# Patient Record
Sex: Male | Born: 1985
Health system: Southern US, Community
[De-identification: ages and names within clinical notes are randomized; demographics above are authoritative.]

## PROBLEM LIST (undated history)

## (undated) DIAGNOSIS — F4310 Post-traumatic stress disorder, unspecified: Secondary | ICD-10-CM

## (undated) DIAGNOSIS — T4145XA Adverse effect of unspecified anesthetic, initial encounter: Secondary | ICD-10-CM

## (undated) DIAGNOSIS — T8859XA Other complications of anesthesia, initial encounter: Secondary | ICD-10-CM

## (undated) DIAGNOSIS — I10 Essential (primary) hypertension: Secondary | ICD-10-CM

## (undated) HISTORY — PX: OTHER SURGICAL HISTORY: SHX169

## (undated) HISTORY — DX: Post-traumatic stress disorder, unspecified: F43.10

## (undated) HISTORY — PX: KNEE SURGERY: SHX244

---

## 2014-08-21 ENCOUNTER — Emergency Department (HOSPITAL_COMMUNITY)
Admission: EM | Admit: 2014-08-21 | Discharge: 2014-08-21 | Disposition: A | Discharge status: Home / Self Care | Payer: Non-veteran care | Attending: Emergency Medicine | Admitting: Emergency Medicine

## 2014-08-21 ENCOUNTER — Encounter (HOSPITAL_COMMUNITY): Payer: Self-pay

## 2014-08-21 ENCOUNTER — Emergency Department (HOSPITAL_COMMUNITY): Payer: Non-veteran care

## 2014-08-21 DIAGNOSIS — R109 Unspecified abdominal pain: Secondary | ICD-10-CM

## 2014-08-21 DIAGNOSIS — R1031 Right lower quadrant pain: Secondary | ICD-10-CM | POA: Insufficient documentation

## 2014-08-21 DIAGNOSIS — R1032 Left lower quadrant pain: Secondary | ICD-10-CM | POA: Insufficient documentation

## 2014-08-21 DIAGNOSIS — R1030 Lower abdominal pain, unspecified: Secondary | ICD-10-CM

## 2014-08-21 LAB — COMPREHENSIVE METABOLIC PANEL
ALT: 46 U/L (ref 0–53)
ANION GAP: 7 (ref 5–15)
AST: 36 U/L (ref 0–37)
Albumin: 3.9 g/dL (ref 3.5–5.2)
Alkaline Phosphatase: 70 U/L (ref 39–117)
BILIRUBIN TOTAL: 0.7 mg/dL (ref 0.3–1.2)
BUN: 12 mg/dL (ref 6–23)
CHLORIDE: 102 meq/L (ref 96–112)
CO2: 29 mmol/L (ref 19–32)
CREATININE: 0.95 mg/dL (ref 0.50–1.35)
Calcium: 9 mg/dL (ref 8.4–10.5)
GFR calc Af Amer: >90 mL/min (ref >90)
GFR calc non Af Amer: >90 mL/min (ref >90)
Glucose, Bld: 97 mg/dL (ref 70–99)
Potassium: 4.2 mmol/L (ref 3.5–5.1)
Sodium: 138 mmol/L (ref 135–145)
Total Protein: 6.2 g/dL (ref 6.0–8.3)

## 2014-08-21 LAB — URINALYSIS, ROUTINE W REFLEX MICROSCOPIC
Bilirubin Urine: NEGATIVE
Glucose, UA: NEGATIVE mg/dL
Hgb urine dipstick: NEGATIVE
KETONES UR: NEGATIVE mg/dL
LEUKOCYTES UA: NEGATIVE
NITRITE: NEGATIVE
PH: 7 (ref 5.0–8.0)
PROTEIN: NEGATIVE mg/dL
Specific Gravity, Urine: 1.017 (ref 1.005–1.030)
UROBILINOGEN UA: 0.2 mg/dL (ref 0.0–1.0)

## 2014-08-21 LAB — CBC WITH DIFFERENTIAL/PLATELET
BASOS PCT: 0 % (ref 0–1)
Basophils Absolute: 0 10*3/uL (ref 0.0–0.1)
EOS PCT: 1 % (ref 0–5)
Eosinophils Absolute: 0.1 10*3/uL (ref 0.0–0.7)
HCT: 46.2 % (ref 39.0–52.0)
Hemoglobin: 15.9 g/dL (ref 13.0–17.0)
LYMPHS PCT: 9 % — AB (ref 12–46)
Lymphs Abs: 1.3 10*3/uL (ref 0.7–4.0)
MCH: 29.9 pg (ref 26.0–34.0)
MCHC: 34.4 g/dL (ref 30.0–36.0)
MCV: 86.8 fL (ref 78.0–100.0)
Monocytes Absolute: 1 10*3/uL (ref 0.1–1.0)
Monocytes Relative: 7 % (ref 3–12)
Neutro Abs: 12.2 10*3/uL — ABNORMAL HIGH (ref 1.7–7.7)
Neutrophils Relative %: 83 % — ABNORMAL HIGH (ref 43–77)
Platelets: 227 10*3/uL (ref 150–400)
RBC: 5.32 MIL/uL (ref 4.22–5.81)
RDW: 12.9 % (ref 11.5–15.5)
WBC: 14.7 10*3/uL — AB (ref 4.0–10.5)

## 2014-08-21 MED ORDER — MORPHINE SULFATE 4 MG/ML IJ SOLN: 4.0000 mg | Freq: Once | INTRAMUSCULAR | Status: DC

## 2014-08-21 MED ORDER — SODIUM CHLORIDE 0.9 % IV BOLUS (SEPSIS)
1000.0000 mL | Freq: Once | INTRAVENOUS | Status: AC
  Administered 2014-08-21: 1000 mL via INTRAVENOUS

## 2014-08-21 MED ORDER — IOHEXOL 300 MG/ML  SOLN
25.0000 mL | Freq: Once | INTRAMUSCULAR | Status: AC | PRN
  Administered 2014-08-21: 25 mL via ORAL

## 2014-08-21 MED ORDER — IOHEXOL 300 MG/ML  SOLN
100.0000 mL | Freq: Once | INTRAMUSCULAR | Status: AC | PRN
  Administered 2014-08-21: 100 mL via INTRAVENOUS

## 2014-08-21 NOTE — ED Notes (Signed)
PA is at the bedside with pt 

## 2014-08-21 NOTE — Discharge Instructions (Signed)
Call for a follow up appointment with a Family or Primary Care Provider.  Return if Symptoms worsen.

## 2014-08-21 NOTE — ED Provider Notes (Signed)
CSN: [id number]     Arrival date & time 08/21/14  1049 History   First MD Initiated Contact with Patient 08/21/14 1302     Chief Complaint  Patient presents with  . Abdominal Pain     (Consider location/radiation/quality/duration/timing/severity/associated sxs/prior Treatment) HPI Comments: The patient is a 28 year old male presents emergency room chief complaint of abrupt onset of right lower quadrant pain, resolving. He reports pain worsened with movement and position initially. He reports symptom improvement after passing gas in the emergency department. Denies decrease in appetite, fever, diarrhea, constipation, hematuria, dysuria. Denies history of renal calculi previous abdominal surgeries. Patient is actively discussing going out for sushi after being discharged. Last oral intake less than 1 hour prior to arrival. [facility]  The history is provided by the patient. No language interpreter was used.    History reviewed. No pertinent past medical history. History reviewed. No pertinent past surgical history. No family history on file. History  Substance Use Topics  . Smoking status: Never Smoker   . Smokeless tobacco: Not on file  . Alcohol Use: Yes     Comment: occas    Review of Systems  Constitutional: Negative for fever, chills and appetite change.  Gastrointestinal: Positive for abdominal pain. Negative for nausea, vomiting and diarrhea.  Genitourinary: Negative for dysuria and hematuria.      Allergies  Review of patient's allergies indicates no known allergies.  Home Medications   Prior to Admission medications   Not on File   BP 157/87 mmHg  Pulse 103  Temp(Src) 98.4 F (36.9 C) (Oral)  Resp 20  SpO2 100% Physical Exam  Constitutional: He is oriented to person, place, and time. He appears well-developed and well-nourished. No distress.  HENT:  Head: Normocephalic and atraumatic.  Mouth/Throat: No oropharyngeal exudate.  Eyes: EOM are normal. Pupils are  equal, round, and reactive to light.  Neck: Neck supple.  Cardiovascular: Normal rate and regular rhythm.   Pulmonary/Chest: Effort normal. No respiratory distress. He has no wheezes.  Abdominal: Soft. There is tenderness in the right lower quadrant, suprapubic area and left lower quadrant. There is no rebound, no guarding, no CVA tenderness, no tenderness at McBurney's point and negative Murphy's sign.  Mild lower abdominal discomfort with palpation. Negative psoas, negative rosving's.  Musculoskeletal: Normal range of motion.  Neurological: He is oriented to person, place, and time.  Skin: Skin is warm and dry. He is not diaphoretic.  Psychiatric: He has a normal mood and affect. His behavior is normal.  Nursing note and vitals reviewed.   ED Course  Procedures (including critical care time) Labs Review Labs Reviewed  CBC WITH DIFFERENTIAL - Abnormal; Notable for the following:    WBC 14.7 (*)    Neutrophils Relative % 83 (*)    Neutro Abs 12.2 (*)    Lymphocytes Relative 9 (*)    All other components within normal limits  COMPREHENSIVE METABOLIC PANEL  URINALYSIS, ROUTINE W REFLEX MICROSCOPIC    Imaging Review Ct Abdomen Pelvis W Contrast  08/21/2014   CLINICAL DATA:  Acute onset right lower quadrant pain this morning which has now resolved. Mid abdominal pain presently.  EXAM: CT ABDOMEN AND PELVIS WITH CONTRAST  TECHNIQUE: Multidetector CT imaging of the abdomen and pelvis was performed using the standard protocol following bolus administration of intravenous contrast.  CONTRAST:  100 mL OMNIPAQUE IOHEXOL 300 MG/ML  SOLN  COMPARISON:  None.  FINDINGS: The lung bases are clear. No pleural or pericardial effusion. Heart size  is normal.  The gallbladder, liver, spleen, adrenal glands, pancreas and kidneys all appear normal. The stomach, small and large bowel and appendix appear normal. There is no lymphadenopathy or fluid. No bony abnormality is identified.  IMPRESSION: Negative for  appendicitis.  Negative CT abdomen and pelvis.   Electronically Signed   By: Drusilla Kannerhomas  Dalessio M.D.   On: 08/21/2014 14:42     EKG Interpretation None      MDM   Final diagnoses:  Abdominal pain   Patient presents with generalized lower abdominal pain, initially right lower quadrant no point tenderness. CT to rule out acute appendicitis. However the patient is discussing food actively, in no acute distress at this time. UA negative for blood, no CVA tenderness doubt renal calculi. CBC shows leukocytosis 14.7. Awaiting CT results. CT without acute abnormalities.  1450 discussed negative CT results with the patient, reports after multiple episodes of gas in ED and during CT continues to have resolution of abdominal discomfort. Discussed patient discomfort likely due to gas, plan to follow-up with PCP.  Meds given in ED:  Medications  morphine 4 MG/ML injection 4 mg (0 mg Intravenous Hold 08/21/14 1323)  iohexol (OMNIPAQUE) 300 MG/ML solution 25 mL (25 mLs Oral Contrast Given 08/21/14 1220)  sodium chloride 0.9 % bolus 1,000 mL (1,000 mLs Intravenous New Bag/Given 08/21/14 1324)  iohexol (OMNIPAQUE) 300 MG/ML solution 100 mL (100 mLs Intravenous Contrast Given 08/21/14 1413)    New Prescriptions   No medications on file    Mellody DrownLauren Sheilah Rayos, PA-C 08/21/14 1458  Warnell Foresterrey Wofford, MD 08/21/14 1807

## 2014-08-21 NOTE — ED Notes (Addendum)
Pt having sudden onset RLQ pain with nausea, no vomiting. Stool was runny this morning when he used the restroom. No fever.

## 2017-03-31 DIAGNOSIS — R55 Syncope and collapse: Secondary | ICD-10-CM | POA: Diagnosis not present

## 2017-03-31 DIAGNOSIS — A084 Viral intestinal infection, unspecified: Secondary | ICD-10-CM | POA: Diagnosis not present

## 2017-03-31 DIAGNOSIS — J069 Acute upper respiratory infection, unspecified: Secondary | ICD-10-CM | POA: Diagnosis not present

## 2017-07-18 DIAGNOSIS — M25462 Effusion, left knee: Secondary | ICD-10-CM | POA: Diagnosis not present

## 2017-07-22 DIAGNOSIS — M25562 Pain in left knee: Secondary | ICD-10-CM | POA: Diagnosis not present

## 2017-07-23 DIAGNOSIS — M25562 Pain in left knee: Secondary | ICD-10-CM | POA: Diagnosis not present

## 2017-07-31 DIAGNOSIS — F4310 Post-traumatic stress disorder, unspecified: Secondary | ICD-10-CM | POA: Diagnosis not present

## 2017-08-05 DIAGNOSIS — F4310 Post-traumatic stress disorder, unspecified: Secondary | ICD-10-CM | POA: Diagnosis not present

## 2017-08-06 DIAGNOSIS — F4310 Post-traumatic stress disorder, unspecified: Secondary | ICD-10-CM | POA: Diagnosis not present

## 2017-08-08 DIAGNOSIS — F4310 Post-traumatic stress disorder, unspecified: Secondary | ICD-10-CM | POA: Diagnosis not present

## 2017-08-09 DIAGNOSIS — F4310 Post-traumatic stress disorder, unspecified: Secondary | ICD-10-CM | POA: Diagnosis not present

## 2017-08-11 DIAGNOSIS — F4310 Post-traumatic stress disorder, unspecified: Secondary | ICD-10-CM | POA: Diagnosis not present

## 2017-08-12 DIAGNOSIS — F4310 Post-traumatic stress disorder, unspecified: Secondary | ICD-10-CM | POA: Diagnosis not present

## 2017-08-15 DIAGNOSIS — F4310 Post-traumatic stress disorder, unspecified: Secondary | ICD-10-CM | POA: Diagnosis not present

## 2017-08-18 DIAGNOSIS — F4310 Post-traumatic stress disorder, unspecified: Secondary | ICD-10-CM | POA: Diagnosis not present

## 2017-08-21 DIAGNOSIS — F4310 Post-traumatic stress disorder, unspecified: Secondary | ICD-10-CM | POA: Diagnosis not present

## 2017-08-22 DIAGNOSIS — F4310 Post-traumatic stress disorder, unspecified: Secondary | ICD-10-CM | POA: Diagnosis not present

## 2017-08-25 DIAGNOSIS — F4310 Post-traumatic stress disorder, unspecified: Secondary | ICD-10-CM | POA: Diagnosis not present

## 2017-08-26 DIAGNOSIS — F4310 Post-traumatic stress disorder, unspecified: Secondary | ICD-10-CM | POA: Diagnosis not present

## 2017-08-29 DIAGNOSIS — F4310 Post-traumatic stress disorder, unspecified: Secondary | ICD-10-CM | POA: Diagnosis not present

## 2017-09-19 DIAGNOSIS — F419 Anxiety disorder, unspecified: Secondary | ICD-10-CM | POA: Diagnosis not present

## 2017-09-23 DIAGNOSIS — F419 Anxiety disorder, unspecified: Secondary | ICD-10-CM | POA: Diagnosis not present

## 2017-09-24 DIAGNOSIS — J101 Influenza due to other identified influenza virus with other respiratory manifestations: Secondary | ICD-10-CM | POA: Diagnosis not present

## 2017-10-10 DIAGNOSIS — F419 Anxiety disorder, unspecified: Secondary | ICD-10-CM | POA: Diagnosis not present

## 2017-11-20 ENCOUNTER — Encounter: Payer: Self-pay | Admitting: Gastroenterology

## 2017-11-20 DIAGNOSIS — R030 Elevated blood-pressure reading, without diagnosis of hypertension: Secondary | ICD-10-CM | POA: Diagnosis not present

## 2017-11-20 DIAGNOSIS — Z6837 Body mass index (BMI) 37.0-37.9, adult: Secondary | ICD-10-CM | POA: Diagnosis not present

## 2017-11-20 DIAGNOSIS — R112 Nausea with vomiting, unspecified: Secondary | ICD-10-CM | POA: Diagnosis not present

## 2017-11-20 DIAGNOSIS — R197 Diarrhea, unspecified: Secondary | ICD-10-CM | POA: Diagnosis not present

## 2017-11-24 ENCOUNTER — Other Ambulatory Visit: Payer: Self-pay | Admitting: Family Medicine

## 2017-11-24 ENCOUNTER — Encounter: Payer: Self-pay | Admitting: Gastroenterology

## 2017-11-24 ENCOUNTER — Ambulatory Visit
Admission: RE | Admit: 2017-11-24 | Discharge: 2017-11-24 | Disposition: A | Discharge status: Home / Self Care | Payer: BLUE CROSS/BLUE SHIELD | Source: Ambulatory Visit | Attending: Family Medicine | Admitting: Family Medicine

## 2017-11-24 ENCOUNTER — Encounter: Payer: Self-pay | Admitting: Family Medicine

## 2017-11-24 DIAGNOSIS — R109 Unspecified abdominal pain: Secondary | ICD-10-CM

## 2017-11-24 DIAGNOSIS — R030 Elevated blood-pressure reading, without diagnosis of hypertension: Secondary | ICD-10-CM | POA: Diagnosis not present

## 2017-11-24 DIAGNOSIS — K5792 Diverticulitis of intestine, part unspecified, without perforation or abscess without bleeding: Secondary | ICD-10-CM | POA: Diagnosis not present

## 2017-11-24 DIAGNOSIS — R197 Diarrhea, unspecified: Secondary | ICD-10-CM

## 2017-11-24 DIAGNOSIS — K921 Melena: Secondary | ICD-10-CM | POA: Diagnosis not present

## 2017-11-24 DIAGNOSIS — Z6837 Body mass index (BMI) 37.0-37.9, adult: Secondary | ICD-10-CM | POA: Diagnosis not present

## 2017-11-24 MED ORDER — IOPAMIDOL (ISOVUE-300) INJECTION 61%
125.0000 mL | Freq: Once | INTRAVENOUS | Status: AC | PRN
  Administered 2017-11-24: 125 mL via INTRAVENOUS

## 2017-11-25 ENCOUNTER — Telehealth: Payer: Self-pay | Admitting: Internal Medicine

## 2017-11-25 DIAGNOSIS — R197 Diarrhea, unspecified: Secondary | ICD-10-CM | POA: Diagnosis not present

## 2017-11-25 DIAGNOSIS — K529 Noninfective gastroenteritis and colitis, unspecified: Secondary | ICD-10-CM | POA: Diagnosis not present

## 2017-11-25 DIAGNOSIS — Z6837 Body mass index (BMI) 37.0-37.9, adult: Secondary | ICD-10-CM | POA: Diagnosis not present

## 2017-11-25 DIAGNOSIS — K6289 Other specified diseases of anus and rectum: Secondary | ICD-10-CM | POA: Diagnosis not present

## 2017-11-25 NOTE — Telephone Encounter (Signed)
[facility] let [facility] know that the patient has an upcoming appt with Dr Lavon PaganiniNandigam on 11/27/17.  I also spoke with the patient and encouraged him to keep this appt.

## 2017-11-25 NOTE — Telephone Encounter (Signed)
Please see note below. Thank you.

## 2017-11-25 NOTE — Telephone Encounter (Signed)
[facility]ab[facility] called back to clarify, Dr Marina GoodellPerry is not going to call their office, he does not feel that any treatment is necessary before his appt with Dr Lavon PaganiniNandigam this Thursday

## 2017-11-25 NOTE — Telephone Encounter (Signed)
Please let Dr. Duanne Guessewey know that I have reviewed the CT scan of this young man. The patient needs an appointment with advanced GI practitioner this week. No treatment in the interim needed. Please arrange appointment

## 2017-11-26 DIAGNOSIS — K6289 Other specified diseases of anus and rectum: Secondary | ICD-10-CM | POA: Diagnosis not present

## 2017-11-26 DIAGNOSIS — R197 Diarrhea, unspecified: Secondary | ICD-10-CM | POA: Diagnosis not present

## 2017-11-26 DIAGNOSIS — R030 Elevated blood-pressure reading, without diagnosis of hypertension: Secondary | ICD-10-CM | POA: Diagnosis not present

## 2017-11-26 DIAGNOSIS — R109 Unspecified abdominal pain: Secondary | ICD-10-CM | POA: Diagnosis not present

## 2017-11-27 ENCOUNTER — Other Ambulatory Visit (INDEPENDENT_AMBULATORY_CARE_PROVIDER_SITE_OTHER): Payer: BLUE CROSS/BLUE SHIELD

## 2017-11-27 ENCOUNTER — Encounter: Payer: Self-pay | Admitting: Gastroenterology

## 2017-11-27 ENCOUNTER — Ambulatory Visit (INDEPENDENT_AMBULATORY_CARE_PROVIDER_SITE_OTHER): Payer: BLUE CROSS/BLUE SHIELD | Admitting: Gastroenterology

## 2017-11-27 VITALS — BP 136/84 | HR 76 | Ht 75.0 in | Wt 287.4 lb

## 2017-11-27 DIAGNOSIS — K602 Anal fissure, unspecified: Secondary | ICD-10-CM

## 2017-11-27 DIAGNOSIS — K625 Hemorrhage of anus and rectum: Secondary | ICD-10-CM | POA: Diagnosis not present

## 2017-11-27 DIAGNOSIS — R197 Diarrhea, unspecified: Secondary | ICD-10-CM

## 2017-11-27 LAB — CBC WITH DIFFERENTIAL/PLATELET
BASOS PCT: 0.1 % (ref 0.0–3.0)
Basophils Absolute: 0 10*3/uL (ref 0.0–0.1)
EOS PCT: 2 % (ref 0.0–5.0)
Eosinophils Absolute: 0.1 10*3/uL (ref 0.0–0.7)
HCT: 46.3 % (ref 39.0–52.0)
Hemoglobin: 15.9 g/dL (ref 13.0–17.0)
Lymphocytes Relative: 25.5 % (ref 12.0–46.0)
Lymphs Abs: 1.9 10*3/uL (ref 0.7–4.0)
MCHC: 34.4 g/dL (ref 30.0–36.0)
MCV: 86.5 fl (ref 78.0–100.0)
MONO ABS: 0.7 10*3/uL (ref 0.1–1.0)
Monocytes Relative: 9.2 % (ref 3.0–12.0)
Neutro Abs: 4.8 10*3/uL (ref 1.4–7.7)
Neutrophils Relative %: 63.2 % (ref 43.0–77.0)
Platelets: 286 10*3/uL (ref 150.0–400.0)
RBC: 5.35 Mil/uL (ref 4.22–5.81)
RDW: 13.9 % (ref 11.5–15.5)
WBC: 7.6 10*3/uL (ref 4.0–10.5)

## 2017-11-27 LAB — COMPREHENSIVE METABOLIC PANEL
ALT: 59 U/L — ABNORMAL HIGH (ref 0–53)
AST: 45 U/L — ABNORMAL HIGH (ref 0–37)
Albumin: 4.3 g/dL (ref 3.5–5.2)
Alkaline Phosphatase: 72 U/L (ref 39–117)
BUN: 12 mg/dL (ref 6–23)
CHLORIDE: 102 meq/L (ref 96–112)
CO2: 30 mEq/L (ref 19–32)
Calcium: 9.7 mg/dL (ref 8.4–10.5)
Creatinine, Ser: 1.04 mg/dL (ref 0.40–1.50)
GFR: 89.25 mL/min (ref >60.00)
Glucose, Bld: 87 mg/dL (ref 70–99)
Potassium: 3.9 mEq/L (ref 3.5–5.1)
SODIUM: 139 meq/L (ref 135–145)
Total Bilirubin: 0.4 mg/dL (ref 0.2–1.2)
Total Protein: 7.3 g/dL (ref 6.0–8.3)

## 2017-11-27 LAB — HIGH SENSITIVITY CRP: CRP, High Sensitivity: 2.6 mg/L (ref 0.000–5.000)

## 2017-11-27 LAB — SEDIMENTATION RATE: SED RATE: 5 mm/h (ref 0–15)

## 2017-11-27 MED ORDER — AMBULATORY NON FORMULARY MEDICATION
1 refills | Status: DC
Start: 2017-11-27 — End: 2017-12-04

## 2017-11-27 NOTE — Patient Instructions (Signed)
Normal BMI (Body Mass Index- based on height and weight) is between 19 and 25. Your BMI today is Body mass index is 35.92 kg/m. Marland Kitchen. Please consider follow up  regarding your BMI with your Primary Care Provider.  Please take Benifiber 1 tablespoon 3 times daily with meals  Your provider has requested that you go to the basement level for lab work before leaving today. Press "B" on the elevator. The lab is located at the first door on the left as you exit the elevator.
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
## 2017-11-29 ENCOUNTER — Encounter: Payer: Self-pay | Admitting: Gastroenterology

## 2017-12-02 ENCOUNTER — Encounter (HOSPITAL_COMMUNITY): Payer: Self-pay

## 2017-12-02 ENCOUNTER — Emergency Department (HOSPITAL_COMMUNITY): Payer: BLUE CROSS/BLUE SHIELD

## 2017-12-02 ENCOUNTER — Inpatient Hospital Stay (HOSPITAL_COMMUNITY)
Admission: EM | Admit: 2017-12-02 | Discharge: 2017-12-04 | DRG: 392 | Disposition: A | Discharge status: Home / Self Care | Payer: BLUE CROSS/BLUE SHIELD | Attending: Internal Medicine | Admitting: Internal Medicine

## 2017-12-02 DIAGNOSIS — K529 Noninfective gastroenteritis and colitis, unspecified: Secondary | ICD-10-CM | POA: Diagnosis not present

## 2017-12-02 DIAGNOSIS — A419 Sepsis, unspecified organism: Secondary | ICD-10-CM

## 2017-12-02 DIAGNOSIS — K648 Other hemorrhoids: Secondary | ICD-10-CM | POA: Diagnosis not present

## 2017-12-02 DIAGNOSIS — Z6836 Body mass index (BMI) 36.0-36.9, adult: Secondary | ICD-10-CM | POA: Diagnosis not present

## 2017-12-02 DIAGNOSIS — Z833 Family history of diabetes mellitus: Secondary | ICD-10-CM

## 2017-12-02 DIAGNOSIS — K621 Rectal polyp: Secondary | ICD-10-CM | POA: Diagnosis present

## 2017-12-02 DIAGNOSIS — Z9119 Patient's noncompliance with other medical treatment and regimen: Secondary | ICD-10-CM

## 2017-12-02 DIAGNOSIS — E669 Obesity, unspecified: Secondary | ICD-10-CM | POA: Diagnosis present

## 2017-12-02 DIAGNOSIS — R079 Chest pain, unspecified: Secondary | ICD-10-CM | POA: Diagnosis not present

## 2017-12-02 DIAGNOSIS — K5190 Ulcerative colitis, unspecified, without complications: Secondary | ICD-10-CM | POA: Diagnosis not present

## 2017-12-02 DIAGNOSIS — R202 Paresthesia of skin: Secondary | ICD-10-CM | POA: Diagnosis not present

## 2017-12-02 DIAGNOSIS — Z8249 Family history of ischemic heart disease and other diseases of the circulatory system: Secondary | ICD-10-CM

## 2017-12-02 DIAGNOSIS — K921 Melena: Secondary | ICD-10-CM

## 2017-12-02 DIAGNOSIS — K5730 Diverticulosis of large intestine without perforation or abscess without bleeding: Secondary | ICD-10-CM | POA: Diagnosis not present

## 2017-12-02 DIAGNOSIS — A09 Infectious gastroenteritis and colitis, unspecified: Principal | ICD-10-CM | POA: Diagnosis present

## 2017-12-02 DIAGNOSIS — K5140 Inflammatory polyps of colon without complications: Secondary | ICD-10-CM | POA: Diagnosis not present

## 2017-12-02 DIAGNOSIS — K6289 Other specified diseases of anus and rectum: Secondary | ICD-10-CM | POA: Diagnosis not present

## 2017-12-02 DIAGNOSIS — K6389 Other specified diseases of intestine: Secondary | ICD-10-CM | POA: Diagnosis not present

## 2017-12-02 HISTORY — DX: Other complications of anesthesia, initial encounter: T88.59XA

## 2017-12-02 HISTORY — DX: Adverse effect of unspecified anesthetic, initial encounter: T41.45XA

## 2017-12-02 LAB — I-STAT CG4 LACTIC ACID, ED: Lactic Acid, Venous: 0.88 mmol/L (ref 0.5–1.9)

## 2017-12-02 LAB — I-STAT TROPONIN, ED: TROPONIN I, POC: 0 ng/mL (ref 0.00–0.08)

## 2017-12-02 MED ORDER — ACETAMINOPHEN 500 MG PO TABS
1000.0000 mg | ORAL_TABLET | Freq: Once | ORAL | Status: AC
  Administered 2017-12-03: 1000 mg via ORAL
  Filled 2017-12-02: qty 2

## 2017-12-02 MED ORDER — SODIUM CHLORIDE 0.9 % IV BOLUS
3000.0000 mL | Freq: Once | INTRAVENOUS | Status: AC
  Administered 2017-12-02: 3000 mL via INTRAVENOUS

## 2017-12-02 NOTE — ED Triage Notes (Signed)
Pt complains of chest pain and being short of breath since today, he states he's had rectal bleeding for a week and it hurts to take a deep breath or talk Pt is being seen by a GI doctor and has received antibiotics
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
## 2017-12-03 ENCOUNTER — Emergency Department (HOSPITAL_COMMUNITY): Payer: BLUE CROSS/BLUE SHIELD

## 2017-12-03 ENCOUNTER — Encounter (HOSPITAL_COMMUNITY): Payer: Self-pay

## 2017-12-03 ENCOUNTER — Inpatient Hospital Stay (HOSPITAL_COMMUNITY): Payer: BLUE CROSS/BLUE SHIELD

## 2017-12-03 ENCOUNTER — Other Ambulatory Visit: Payer: Self-pay

## 2017-12-03 DIAGNOSIS — Z6836 Body mass index (BMI) 36.0-36.9, adult: Secondary | ICD-10-CM | POA: Diagnosis not present

## 2017-12-03 DIAGNOSIS — E669 Obesity, unspecified: Secondary | ICD-10-CM | POA: Diagnosis present

## 2017-12-03 DIAGNOSIS — Z9119 Patient's noncompliance with other medical treatment and regimen: Secondary | ICD-10-CM | POA: Diagnosis not present

## 2017-12-03 DIAGNOSIS — K5140 Inflammatory polyps of colon without complications: Secondary | ICD-10-CM | POA: Diagnosis not present

## 2017-12-03 DIAGNOSIS — K6289 Other specified diseases of anus and rectum: Secondary | ICD-10-CM | POA: Diagnosis not present

## 2017-12-03 DIAGNOSIS — K529 Noninfective gastroenteritis and colitis, unspecified: Secondary | ICD-10-CM | POA: Diagnosis not present

## 2017-12-03 DIAGNOSIS — R202 Paresthesia of skin: Secondary | ICD-10-CM | POA: Diagnosis not present

## 2017-12-03 DIAGNOSIS — A09 Infectious gastroenteritis and colitis, unspecified: Secondary | ICD-10-CM | POA: Diagnosis present

## 2017-12-03 DIAGNOSIS — K648 Other hemorrhoids: Secondary | ICD-10-CM | POA: Diagnosis present

## 2017-12-03 DIAGNOSIS — K621 Rectal polyp: Secondary | ICD-10-CM | POA: Diagnosis not present

## 2017-12-03 DIAGNOSIS — K5190 Ulcerative colitis, unspecified, without complications: Secondary | ICD-10-CM | POA: Diagnosis not present

## 2017-12-03 DIAGNOSIS — K6389 Other specified diseases of intestine: Secondary | ICD-10-CM | POA: Diagnosis not present

## 2017-12-03 DIAGNOSIS — K5730 Diverticulosis of large intestine without perforation or abscess without bleeding: Secondary | ICD-10-CM | POA: Diagnosis present

## 2017-12-03 DIAGNOSIS — K921 Melena: Secondary | ICD-10-CM | POA: Diagnosis not present

## 2017-12-03 DIAGNOSIS — Z833 Family history of diabetes mellitus: Secondary | ICD-10-CM | POA: Diagnosis not present

## 2017-12-03 DIAGNOSIS — Z8249 Family history of ischemic heart disease and other diseases of the circulatory system: Secondary | ICD-10-CM | POA: Diagnosis not present

## 2017-12-03 LAB — URINALYSIS, ROUTINE W REFLEX MICROSCOPIC
BACTERIA UA: NONE SEEN
BILIRUBIN URINE: NEGATIVE
Glucose, UA: NEGATIVE mg/dL
KETONES UR: NEGATIVE mg/dL
LEUKOCYTES UA: NEGATIVE
NITRITE: NEGATIVE
Protein, ur: NEGATIVE mg/dL
SPECIFIC GRAVITY, URINE: 1.021 (ref 1.005–1.030)
SQUAMOUS EPITHELIAL / LPF: NONE SEEN
pH: 5 (ref 5.0–8.0)

## 2017-12-03 LAB — GASTROINTESTINAL PANEL BY PCR, STOOL (REPLACES STOOL CULTURE)
ADENOVIRUS F40/41: NOT DETECTED
ASTROVIRUS: NOT DETECTED
CAMPYLOBACTER SPECIES: NOT DETECTED
Cryptosporidium: NOT DETECTED
Cyclospora cayetanensis: NOT DETECTED
ENTAMOEBA HISTOLYTICA: NOT DETECTED
ENTEROPATHOGENIC E COLI (EPEC): NOT DETECTED
ENTEROTOXIGENIC E COLI (ETEC): NOT DETECTED
Enteroaggregative E coli (EAEC): NOT DETECTED
Giardia lamblia: NOT DETECTED
NOROVIRUS GI/GII: NOT DETECTED
PLESIMONAS SHIGELLOIDES: NOT DETECTED
Rotavirus A: NOT DETECTED
Salmonella species: NOT DETECTED
Sapovirus (I, II, IV, and V): NOT DETECTED
Shiga like toxin producing E coli (STEC): NOT DETECTED
Shigella/Enteroinvasive E coli (EIEC): NOT DETECTED
VIBRIO CHOLERAE: NOT DETECTED
VIBRIO SPECIES: NOT DETECTED
Yersinia enterocolitica: NOT DETECTED

## 2017-12-03 LAB — COMPREHENSIVE METABOLIC PANEL
ALBUMIN: 3.4 g/dL — AB (ref 3.5–5.0)
ALT: 37 U/L (ref 17–63)
AST: 25 U/L (ref 15–41)
Alkaline Phosphatase: 56 U/L (ref 38–126)
Anion gap: 9 (ref 5–15)
BUN: 9 mg/dL (ref 6–20)
CALCIUM: 8.1 mg/dL — AB (ref 8.9–10.3)
CHLORIDE: 109 mmol/L (ref 101–111)
CO2: 21 mmol/L — AB (ref 22–32)
Creatinine, Ser: 0.94 mg/dL (ref 0.61–1.24)
GFR calc non Af Amer: >60 mL/min (ref >60)
GLUCOSE: 102 mg/dL — AB (ref 65–99)
Potassium: 3.7 mmol/L (ref 3.5–5.1)
SODIUM: 139 mmol/L (ref 135–145)
Total Bilirubin: 1 mg/dL (ref 0.3–1.2)
Total Protein: 6.3 g/dL — ABNORMAL LOW (ref 6.5–8.1)

## 2017-12-03 LAB — BASIC METABOLIC PANEL
Anion gap: 11 (ref 5–15)
BUN: 12 mg/dL (ref 6–20)
CALCIUM: 9 mg/dL (ref 8.9–10.3)
CO2: 23 mmol/L (ref 22–32)
Chloride: 103 mmol/L (ref 101–111)
Creatinine, Ser: 1.22 mg/dL (ref 0.61–1.24)
GFR calc non Af Amer: >60 mL/min (ref >60)
Glucose, Bld: 115 mg/dL — ABNORMAL HIGH (ref 65–99)
Potassium: 3.6 mmol/L (ref 3.5–5.1)
SODIUM: 137 mmol/L (ref 135–145)

## 2017-12-03 LAB — HEPATIC FUNCTION PANEL
ALT: 49 U/L (ref 17–63)
AST: 33 U/L (ref 15–41)
Albumin: 4.2 g/dL (ref 3.5–5.0)
Alkaline Phosphatase: 69 U/L (ref 38–126)
BILIRUBIN DIRECT: 0.1 mg/dL (ref 0.1–0.5)
BILIRUBIN INDIRECT: 1 mg/dL — AB (ref 0.3–0.9)
TOTAL PROTEIN: 7.4 g/dL (ref 6.5–8.1)
Total Bilirubin: 1.1 mg/dL (ref 0.3–1.2)

## 2017-12-03 LAB — CBC
HCT: 40.1 % (ref 39.0–52.0)
HCT: 43.7 % (ref 39.0–52.0)
Hemoglobin: 13.5 g/dL (ref 13.0–17.0)
Hemoglobin: 15.1 g/dL (ref 13.0–17.0)
MCH: 29.4 pg (ref 26.0–34.0)
MCH: 30.1 pg (ref 26.0–34.0)
MCHC: 33.7 g/dL (ref 30.0–36.0)
MCHC: 34.6 g/dL (ref 30.0–36.0)
MCV: 87.4 fL (ref 78.0–100.0)
MCV: 87.2 fL (ref 78.0–100.0)
PLATELETS: 198 10*3/uL (ref 150–400)
PLATELETS: 252 10*3/uL (ref 150–400)
RBC: 4.59 MIL/uL (ref 4.22–5.81)
RBC: 5.01 MIL/uL (ref 4.22–5.81)
RDW: 13.3 % (ref 11.5–15.5)
RDW: 13.5 % (ref 11.5–15.5)
WBC: 15.3 10*3/uL — ABNORMAL HIGH (ref 4.0–10.5)
WBC: 22 10*3/uL — AB (ref 4.0–10.5)

## 2017-12-03 LAB — C DIFFICILE QUICK SCREEN W PCR REFLEX
C Diff antigen: NEGATIVE
C Diff interpretation: NOT DETECTED
C Diff toxin: NEGATIVE

## 2017-12-03 LAB — HIV ANTIBODY (ROUTINE TESTING W REFLEX): HIV SCREEN 4TH GENERATION: NONREACTIVE

## 2017-12-03 MED ORDER — ONDANSETRON HCL 4 MG PO TABS: 4.0000 mg | ORAL_TABLET | Freq: Four times a day (QID) | ORAL | Status: DC | PRN

## 2017-12-03 MED ORDER — BOOST / RESOURCE BREEZE PO LIQD CUSTOM
1.0000 | Freq: Three times a day (TID) | ORAL | Status: DC
  Administered 2017-12-03: 1 via ORAL
  Administered 2017-12-03: 20:00:00 via ORAL
  Administered 2017-12-03: 1 via ORAL

## 2017-12-03 MED ORDER — POTASSIUM CHLORIDE IN NACL 20-0.9 MEQ/L-% IV SOLN
INTRAVENOUS | Status: DC
  Administered 2017-12-03: 06:00:00 via INTRAVENOUS
  Filled 2017-12-03: qty 1000

## 2017-12-03 MED ORDER — ONDANSETRON HCL 4 MG/2ML IJ SOLN: 4.0000 mg | Freq: Four times a day (QID) | INTRAMUSCULAR | Status: DC | PRN

## 2017-12-03 MED ORDER — METRONIDAZOLE IN NACL 5-0.79 MG/ML-% IV SOLN
500.0000 mg | Freq: Three times a day (TID) | INTRAVENOUS | Status: DC
  Administered 2017-12-03 – 2017-12-04 (×4): 500 mg via INTRAVENOUS
  Filled 2017-12-03 (×4): qty 100

## 2017-12-03 MED ORDER — IOPAMIDOL (ISOVUE-300) INJECTION 61%
INTRAVENOUS | Status: AC
  Filled 2017-12-03: qty 100

## 2017-12-03 MED ORDER — TRAMADOL HCL 50 MG PO TABS
50.0000 mg | ORAL_TABLET | Freq: Four times a day (QID) | ORAL | Status: DC | PRN
  Administered 2017-12-03: 50 mg via ORAL
  Filled 2017-12-03: qty 1

## 2017-12-03 MED ORDER — PEG-KCL-NACL-NASULF-NA ASC-C 100 G PO SOLR
0.5000 | Freq: Once | ORAL | Status: AC
  Administered 2017-12-03: 100 g via ORAL
  Filled 2017-12-03: qty 1

## 2017-12-03 MED ORDER — CIPROFLOXACIN IN D5W 400 MG/200ML IV SOLN
400.0000 mg | Freq: Two times a day (BID) | INTRAVENOUS | Status: DC
Start: 2017-12-03 — End: 2017-12-04
  Administered 2017-12-03 – 2017-12-04 (×3): 400 mg via INTRAVENOUS
  Filled 2017-12-03 (×5): qty 200

## 2017-12-03 MED ORDER — PEG-KCL-NACL-NASULF-NA ASC-C 100 G PO SOLR: 1.0000 | Freq: Once | ORAL | Status: DC

## 2017-12-03 MED ORDER — ACETAMINOPHEN 650 MG RE SUPP: 650.0000 mg | Freq: Four times a day (QID) | RECTAL | Status: DC | PRN

## 2017-12-03 MED ORDER — MORPHINE SULFATE (PF) 4 MG/ML IV SOLN
4.0000 mg | Freq: Once | INTRAVENOUS | Status: AC
  Administered 2017-12-03: 4 mg via INTRAVENOUS
  Filled 2017-12-03: qty 1

## 2017-12-03 MED ORDER — ACETAMINOPHEN 325 MG PO TABS
650.0000 mg | ORAL_TABLET | Freq: Four times a day (QID) | ORAL | Status: DC | PRN
  Administered 2017-12-03 (×2): 650 mg via ORAL
  Filled 2017-12-03 (×3): qty 2

## 2017-12-03 MED ORDER — FENTANYL CITRATE (PF) 100 MCG/2ML IJ SOLN
25.0000 ug | INTRAMUSCULAR | Status: DC | PRN
  Administered 2017-12-03: 25 ug via INTRAVENOUS
  Administered 2017-12-03 (×2): 50 ug via INTRAVENOUS
  Filled 2017-12-03 (×3): qty 2

## 2017-12-03 MED ORDER — IOPAMIDOL (ISOVUE-300) INJECTION 61%
100.0000 mL | Freq: Once | INTRAVENOUS | Status: AC | PRN
  Administered 2017-12-03: 100 mL via INTRAVENOUS

## 2017-12-03 MED ORDER — POTASSIUM CHLORIDE IN NACL 20-0.9 MEQ/L-% IV SOLN
INTRAVENOUS | Status: DC
  Administered 2017-12-03: 14:00:00 via INTRAVENOUS
  Filled 2017-12-03 (×2): qty 1000

## 2017-12-03 MED ORDER — PEG-KCL-NACL-NASULF-NA ASC-C 100 G PO SOLR
0.5000 | Freq: Once | ORAL | Status: AC
  Administered 2017-12-04: 100 g via ORAL

## 2017-12-03 MED ORDER — ONDANSETRON HCL 4 MG/2ML IJ SOLN
4.0000 mg | Freq: Once | INTRAMUSCULAR | Status: AC
  Administered 2017-12-03: 4 mg via INTRAVENOUS
  Filled 2017-12-03: qty 2

## 2017-12-03 MED ORDER — PIPERACILLIN-TAZOBACTAM 3.375 G IVPB 30 MIN
3.3750 g | Freq: Once | INTRAVENOUS | Status: AC
  Administered 2017-12-03: 3.375 g via INTRAVENOUS
  Filled 2017-12-03: qty 50

## 2017-12-03 MED ORDER — MORPHINE SULFATE (PF) 2 MG/ML IV SOLN: 1.0000 mg | INTRAVENOUS | Status: DC | PRN

## 2017-12-03 NOTE — Progress Notes (Signed)
Pharmacy Antibiotic Note  Alexander ShihRonald F Pottle Michael is a 32 y.o. male admitted on 12/02/2017 with Intra-abdominal infection.  Pharmacy has been consulted for Ciprofloxacin dosing.  Plan: Ciprofloxacin 400mg  iv q12hr Flagyl 500mg  iv q8hr    Temp (24hrs), Avg:99.4 F (37.4 C), Min:99 F (37.2 C), Max:99.7 F (37.6 C)  Recent Labs  Lab 11/27/17 1214 12/02/17 2338  WBC 7.6 22.0*  CREATININE 1.04 1.22  LATICACIDVEN  --  0.88    Estimated Creatinine Clearance: 130 mL/min (by C-G formula based on SCr of 1.22 mg/dL).    No Known Allergies  Antimicrobials this admission: Zosyn 12/03/2017 >> 12/03/2017  Ciprofloxacin 12/03/2017 >> Flagy 12/03/2017 >>  Dose adjustments this admission: -  Microbiology results: -  Thank you for allowing pharmacy to be a part of this patient's care.  Alexander Michael, Alexander Michael 12/03/2017 4:58 AM

## 2017-12-03 NOTE — Progress Notes (Signed)
Patient returned from CT

## 2017-12-03 NOTE — Progress Notes (Signed)
Nutrition Brief Note  Patient identified on the Malnutrition Screening Tool (MST) Report  Wt Readings from Last 15 Encounters:  12/03/17 286 lb (129.7 kg)  11/27/17 287 lb 6.4 oz (130.4 kg)    Body mass index is 36.23 kg/m. Patient meets criteria for obesity based on current BMI. No weight hx in the chart other than the two entries listed above. Pt reports that he does not have a scale at home but that recently his clothes have been fitting more loosely. He reports that he usually fluctuates between 285-297 lbs but is unsure of anything that causes fluctuation other than an increase in bathroom usage. Skin WDL.  Pt with no PMH who presented to the ED on 4/9 for fevers, adbominal pain, and diarrhea x2-3 weeks. Notes state CT abd at that time was concerning for colitis.   Pt reports good appetite at baseline but had decrease/poor appetite for the few days PTA d/t symptoms. He reports appetite has returned today and he is wanting more than liquids. He denies abdominal pain or nausea with PO intakes today. Current diet order is CLD and pt consumed 100% of breakfast tray, lunch tray, and Boost Breeze this AM. Medications reviewed; 500 mg IV Flagyl TID. Labs reviewed; Ca: 8.1 mg/dL. IVF: NS-20 mEq KCl @ 100 mL/hr.   Boost Breeze ordered TID per ONS protocol, each supplement provides 250 kcal and 9 grams of protein. Continue this supplement while on CLD.  No nutrition interventions warranted at this time. If nutrition issues arise, please consult RD.     Trenton GammonJessica Hersel Mcmeen, MS, RD, LDN, [facility] Inpatient Clinical Dietitian Pager # [phone] After hours/weekend pager # [phone]

## 2017-12-03 NOTE — Progress Notes (Signed)
Patient taken to CT.
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
## 2017-12-03 NOTE — ED Notes (Signed)
Please address the current telemetry order prior to calling report.

## 2017-12-03 NOTE — ED Notes (Signed)
ED TO INPATIENT HANDOFF REPORT  Name/Age/Gender Alexander Michael 32 y.o. male  Code Status    Code Status Orders  (From admission, onward)        Start     Ordered   12/03/17 0354  Full code  Continuous     12/03/17 0355    Code Status History    This patient has a current code status but no historical code status.      Home/SNF/Other Home  Chief Complaint Chest Pain; Shortness of Breath; Fever; Bloody Diarrhea  Level of Care/Admitting Diagnosis ED Disposition    ED Disposition Condition [facility] Area: [facility] [[id number]]  Level of Care: Med-Surg [16]  Diagnosis: Colitis [637858]  Admitting Physician: Vianne Bulls [[id number]]  Attending Physician: Vianne Bulls [[id number]]  Estimated length of stay: past midnight tomorrow  Certification:: I certify this patient will need inpatient services for at least 2 midnights  PT Class (Do Not Modify): Inpatient [101]  PT Acc Code (Do Not Modify): Private [1]       Medical History History reviewed. No pertinent past medical history.  Allergies No Known Allergies  IV Location/Drains/Wounds Patient Lines/Drains/Airways Status   Active Line/Drains/Airways    Name:   Placement date:   Placement time:   Site:   Days:   Peripheral IV 12/02/17 Left Antecubital   12/02/17    2342    Antecubital   1   Peripheral IV 12/03/17 Right Antecubital   12/03/17    0034    Antecubital   less than 1          Labs/Imaging Results for orders placed or performed during the hospital encounter of 12/02/17 (from the past 48 hour(s))  I-stat troponin, ED     Status: None   Collection Time: 12/02/17 11:37 PM  Result Value Ref Range   Troponin i, poc 0.00 0.00 - 0.08 ng/mL   Comment 3            Comment: Due to the release kinetics of cTnI, a negative result within the first hours of the onset of symptoms does not rule out myocardial infarction with certainty. If myocardial infarction is still  suspected, repeat the test at appropriate intervals.   Basic metabolic panel     Status: Abnormal   Collection Time: 12/02/17 11:38 PM  Result Value Ref Range   Sodium 137 135 - 145 mmol/L   Potassium 3.6 3.5 - 5.1 mmol/L   Chloride 103 101 - 111 mmol/L   CO2 23 22 - 32 mmol/L   Glucose, Bld 115 (H) 65 - 99 mg/dL   BUN 12 6 - 20 mg/dL   Creatinine, Ser 1.22 0.61 - 1.24 mg/dL   Calcium 9.0 8.9 - 10.3 mg/dL   GFR calc non Af Amer >60 >60 mL/min   GFR calc Af Amer >60 >60 mL/min    Comment: (NOTE) The eGFR has been calculated using the CKD EPI equation. This calculation has not been validated in all clinical situations. eGFR's persistently <60 mL/min signify possible Chronic Kidney Disease.    Anion gap 11 5 - 15    Comment: Performed at [facility], [place] [address] [place], [place] [address]  CBC     Status: Abnormal   Collection Time: 12/02/17 11:38 PM  Result Value Ref Range   WBC 22.0 (H) 4.0 - 10.5 K/uL   RBC 5.01 4.22 - 5.81 MIL/uL   Hemoglobin 15.1  13.0 - 17.0 g/dL   HCT 43.7 39.0 - 52.0 %   MCV 87.2 78.0 - 100.0 fL   MCH 30.1 26.0 - 34.0 pg   MCHC 34.6 30.0 - 36.0 g/dL   RDW 13.3 11.5 - 15.5 %   Platelets 252 150 - 400 K/uL    Comment: Performed at [facility], [place] [address], [place], [place] [address]  I-Stat CG4 Lactic Acid, ED     Status: None   Collection Time: 12/02/17 11:38 PM  Result Value Ref Range   Lactic Acid, Venous 0.88 0.5 - 1.9 mmol/L  Hepatic function panel     Status: Abnormal   Collection Time: 12/02/17 11:38 PM  Result Value Ref Range   Total Protein 7.4 6.5 - 8.1 g/dL   Albumin 4.2 3.5 - 5.0 g/dL   AST 33 15 - 41 U/L   ALT 49 17 - 63 U/L   Alkaline Phosphatase 69 38 - 126 U/L   Total Bilirubin 1.1 0.3 - 1.2 mg/dL   Bilirubin, Direct 0.1 0.1 - 0.5 mg/dL   Indirect Bilirubin 1.0 (H) 0.3 - 0.9 mg/dL    Comment: Performed at [facility], [place] [address] [place], [place] [address]   Urinalysis, Routine w reflex microscopic     Status: Abnormal   Collection Time: 12/02/17 11:41 PM  Result Value Ref Range   Color, Urine YELLOW YELLOW   APPearance CLEAR CLEAR   Specific Gravity, Urine 1.021 1.005 - 1.030   pH 5.0 5.0 - 8.0   Glucose, UA NEGATIVE NEGATIVE mg/dL   Hgb urine dipstick SMALL (A) NEGATIVE   Bilirubin Urine NEGATIVE NEGATIVE   Ketones, ur NEGATIVE NEGATIVE mg/dL   Protein, ur NEGATIVE NEGATIVE mg/dL   Nitrite NEGATIVE NEGATIVE   Leukocytes, UA NEGATIVE NEGATIVE   RBC / HPF 0-5 0 - 5 RBC/hpf   WBC, UA 0-5 0 - 5 WBC/hpf   Bacteria, UA NONE SEEN NONE SEEN   Squamous Epithelial / LPF NONE SEEN NONE SEEN   Mucus PRESENT     Comment: Performed at [facility], [place] [address], [place], [place] [address]   Dg Chest 2 View  Result Date: 12/02/2017 CLINICAL DATA:  Chest pain EXAM: CHEST - 2 VIEW COMPARISON:  None. FINDINGS: Shallow lung inflation. The heart size and mediastinal contours are within normal limits. Both lungs are clear. The visualized skeletal structures are unremarkable. IMPRESSION: Shallow lung inflation without active cardiopulmonary disease. Electronically Signed   By: Ulyses Jarred M.D.   On: 12/02/2017 23:00   Ct Abdomen Pelvis W Contrast  Result Date: 12/03/2017 CLINICAL DATA:  GI bleeding for 1 week. EXAM: CT ABDOMEN AND PELVIS WITH CONTRAST TECHNIQUE: Multidetector CT imaging of the abdomen and pelvis was performed using the standard protocol following bolus administration of intravenous contrast. CONTRAST:  125m ISOVUE-300 IOPAMIDOL (ISOVUE-300) INJECTION 61% COMPARISON:  CT 9 days prior 11/24/2017 FINDINGS: Lower chest: Lung bases are clear. Hepatobiliary: No focal hepatic lesion. Gallbladder physiologically distended, no calcified stone. No biliary dilatation. Pancreas: No ductal dilatation or inflammation. Spleen: Upper normal in size spanning 13 cm.  No focal abnormality. Adrenals/Urinary Tract: Normal adrenal glands. No  hydronephrosis or perinephric edema. Homogeneous renal enhancement. Urinary bladder is partially distended without wall thickening. Stomach/Bowel: Again seen colonic wall thickening involving the descending and sigmoid colon, as well as rectum. Equivocal wall thickening of the ascending and transverse colon, not well assessed given lack of enteric contrast on the current exam. Minimal pericolonic  edema distally, for example image 80 series 2. No pericolonic or perirectal abscess. Normal appendix. Normal terminal ileum. No small bowel inflammation, evidence of wall thickening or obstruction. Stomach is within normal limits. Vascular/Lymphatic: No enlarged abdominal or pelvic lymph nodes. Few prominent central mesenteric nodes are not enlarged by size criteria. No acute vascular finding. Reproductive: Prostate is unremarkable. Other: No free air, free fluid, or intra-abdominal fluid collection. Musculoskeletal: There are no acute or suspicious osseous abnormalities. IMPRESSION: Persistent rectal wall thickening. Additionally there is mild wall thickening of the descending and sigmoid colon, and equivocal wall thickening of the more proximal colon. Findings suggest mild colitis/proctocolitis, may be infectious or inflammatory. No abscess or perforation. Electronically Signed   By: Jeb Levering M.D.   On: 12/03/2017 02:10    Pending Labs Unresulted Labs (From admission, onward)   Start     Ordered   12/03/17 0500  HIV antibody (Routine Testing)  Tomorrow morning,   R     12/03/17 0355   12/03/17 0500  Comprehensive metabolic panel  Tomorrow morning,   R     12/03/17 0355   12/03/17 0500  CBC  Tomorrow morning,   R     12/03/17 0355   12/02/17 2331  Gastrointestinal Panel by PCR , Stool  (Gastrointestinal Panel by PCR, Stool)  Once,   R     12/02/17 2331   12/02/17 2318  Urine culture  STAT,   STAT     12/02/17 2317   12/02/17 2317  Blood culture (routine x 2)  BLOOD CULTURE X 2,   STAT     12/02/17  2316      Vitals/Pain Today's Vitals   12/03/17 0134 12/03/17 0200 12/03/17 0230 12/03/17 0305  BP:  140/70 (!) 151/75 135/66  Pulse:  96 92 81  Resp:  (!) 24 (!) 23 20  Temp:    99 F (37.2 C)  TempSrc:    Oral  SpO2:  95% 97% 96%  PainSc: 0-No pain   4     Isolation Precautions Enteric precautions (UV disinfection)  Medications Medications  iopamidol (ISOVUE-300) 61 % injection (has no administration in time range)  0.9 % NaCl with KCl 20 mEq/ L  infusion (has no administration in time range)  acetaminophen (TYLENOL) tablet 650 mg (has no administration in time range)    Or  acetaminophen (TYLENOL) suppository 650 mg (has no administration in time range)  ondansetron (ZOFRAN) tablet 4 mg (has no administration in time range)    Or  ondansetron (ZOFRAN) injection 4 mg (has no administration in time range)  fentaNYL (SUBLIMAZE) injection 25-50 mcg (has no administration in time range)  metroNIDAZOLE (FLAGYL) IVPB 500 mg (has no administration in time range)  sodium chloride 0.9 % bolus 3,000 mL (0 mLs Intravenous Stopped 12/03/17 0033)  acetaminophen (TYLENOL) tablet 1,000 mg (1,000 mg Oral Given 12/03/17 0036)  piperacillin-tazobactam (ZOSYN) IVPB 3.375 g (0 g Intravenous Stopped 12/03/17 0131)  iopamidol (ISOVUE-300) 61 % injection 100 mL (100 mLs Intravenous Contrast Given 12/03/17 0114)  morphine 4 MG/ML injection 4 mg (4 mg Intravenous Given 12/03/17 0257)  ondansetron (ZOFRAN) injection 4 mg (4 mg Intravenous Given 12/03/17 0257)    Mobility walks
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
## 2017-12-03 NOTE — Plan of Care (Signed)
Pt alert and oriented, complaints of pelvic and chest pain. Cipro and flagyl ordered by Md, RN will monitor.

## 2017-12-03 NOTE — ED Notes (Signed)
Admitting hospitalist at bedside

## 2017-12-03 NOTE — Progress Notes (Signed)
Alexander Michael is a 32 y.o. male who denies any significant past medical history, now presenting to the emergency department for evaluation of bloody diarrhea, abdominal pain, and fevers going on for 3 weeks now. Admitted for further evaluation. He also complains about right 4 th and 5 th finger numbness and tingling.    Plan:  1. Monitor H&H, IV antibitoics, IV fluids and pain control 2.  Gastroenterology consult to see if he needs steroids for possible inflammatory colitis 3.  CT cervical spine to evaluate for spinal stenosis and radiculopathy.   Kathlen ModyVijaya Nyilah Kight, MD [phone]
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
## 2017-12-04 ENCOUNTER — Inpatient Hospital Stay (HOSPITAL_COMMUNITY): Payer: BLUE CROSS/BLUE SHIELD | Admitting: Certified Registered"

## 2017-12-04 ENCOUNTER — Encounter (HOSPITAL_COMMUNITY): Payer: Self-pay | Admitting: *Deleted

## 2017-12-04 ENCOUNTER — Encounter (HOSPITAL_COMMUNITY)
Admission: EM | Disposition: A | Discharge status: Home / Self Care | Payer: Self-pay | Source: Home / Self Care | Attending: Internal Medicine

## 2017-12-04 DIAGNOSIS — K921 Melena: Secondary | ICD-10-CM

## 2017-12-04 DIAGNOSIS — K6389 Other specified diseases of intestine: Secondary | ICD-10-CM

## 2017-12-04 DIAGNOSIS — K5140 Inflammatory polyps of colon without complications: Secondary | ICD-10-CM

## 2017-12-04 HISTORY — PX: COLONOSCOPY WITH PROPOFOL: SHX5780

## 2017-12-04 LAB — URINE CULTURE: CULTURE: NO GROWTH

## 2017-12-04 LAB — VITAMIN B12: VITAMIN B 12: 311 pg/mL (ref 180–914)

## 2017-12-04 SURGERY — COLONOSCOPY WITH PROPOFOL
Anesthesia: Monitor Anesthesia Care

## 2017-12-04 MED ORDER — CIPROFLOXACIN HCL 500 MG PO TABS: 500.0000 mg | ORAL_TABLET | Freq: Two times a day (BID) | ORAL | 0 refills | Status: AC

## 2017-12-04 MED ORDER — METRONIDAZOLE 500 MG PO TABS: 500.0000 mg | ORAL_TABLET | Freq: Three times a day (TID) | ORAL | 0 refills | Status: AC

## 2017-12-04 MED ORDER — HYDRALAZINE HCL 20 MG/ML IJ SOLN: 5.0000 mg | Freq: Four times a day (QID) | INTRAMUSCULAR | Status: DC | PRN

## 2017-12-04 SURGICAL SUPPLY — 22 items

## 2017-12-04 NOTE — Progress Notes (Signed)
Patient discharged home. Instructions and prescriptions given to patient and mother. Both verbalized understanding and had no questions. Patient left the floor in stable condition.

## 2017-12-04 NOTE — Interval H&P Note (Signed)
History and Physical Interval Note:  12/04/2017 1:23 PM  Alexander Michael  has presented today for surgery, with the diagnosis of colitis, rectal bleeding diarrhea  The various methods of treatment have been discussed with the patient and family. After consideration of risks, benefits and other options for treatment, the patient has consented to  Procedure(s): COLONOSCOPY WITH PROPOFOL (N/A) as a surgical intervention .  The patient's history has been reviewed, patient examined, no change in status, stable for surgery.  I have reviewed the patient's chart and labs.  Questions were answered to the patient's satisfaction.     Lynann Bolognaajesh Lot [place]

## 2017-12-04 NOTE — Anesthesia Preprocedure Evaluation (Deleted)
Anesthesia Evaluation  Patient identified by MRN, date of birth, ID band Patient awake    Reviewed: Allergy & Precautions, H&P , Patient's Chart, lab work & pertinent test results, reviewed documented beta blocker date and time   Airway Mallampati: II  TM Distance: >3 FB Neck ROM: full    Dental no notable dental hx.    Pulmonary    Pulmonary exam normal breath sounds clear to auscultation       Cardiovascular  Rhythm:regular Rate:Normal     Neuro/Psych    GI/Hepatic   Endo/Other    Renal/GU      Musculoskeletal   Abdominal   Peds  Hematology   Anesthesia Other Findings   Reproductive/Obstetrics                             Anesthesia Physical Anesthesia Plan  ASA: II  Anesthesia Plan: General   Post-op Pain Management:    Induction: Intravenous  PONV Risk Score and Plan: Treatment may vary due to age or medical condition  Airway Management Planned: LMA  Additional Equipment:   Intra-op Plan:   Post-operative Plan:   Informed Consent: I have reviewed the patients History and Physical, chart, labs and discussed the procedure including the risks, benefits and alternatives for the proposed anesthesia with the patient or authorized representative who has indicated his/her understanding and acceptance.   Dental Advisory Given  Plan Discussed with: CRNA and Surgeon  Anesthesia Plan Comments: ( )        Anesthesia Quick Evaluation

## 2017-12-04 NOTE — Op Note (Signed)
[facility] Patient Name: Alexander KnucklesRonald Michael Procedure Date: 12/04/2017 MRN: [id number] Attending MD: Lynann Bolognaajesh Kyrus Hyde MD, MD Date of Birth: Dec 27, 1985 CSN: [id number] Age: 32 Admit Type: Inpatient Procedure:                Colonoscopy Indications:              Evaluation of unexplained GI bleeding Providers:                Lynann Bolognaajesh Hanan Mcwilliams MD, MD, Janae SauceKaren D. Steele BergHinson, RN, Beryle BeamsJanie                            Billups, Technician Referring MD:             [facility] Medicines:                None Complications:            No immediate complications. Estimated Blood Loss:     Estimated blood loss: none. Procedure:                Pre-Anesthesia Assessment:                           - Prior to the procedure, a History and Physical                            was performed, and patient medications and                            allergies were reviewed. The patient is competent.                            The risks and benefits of the procedure and the                            sedation options and risks were discussed with the                            patient. All questions were answered and informed                            consent was obtained. Patient identification and                            proposed procedure were verified by the physician                            in the pre-procedure area in the procedure room.                            Mental Status Examination: alert and oriented.                            Prophylactic Antibiotics: The patient does not                            require  prophylactic antibiotics. Prior                            Anticoagulants: The patient has taken no previous                            anticoagulant or antiplatelet agents. ASA Grade                            Assessment: I - A normal, healthy patient. After                            reviewing the risks and benefits, the patient was                            deemed in satisfactory  condition to undergo the                            procedure. The anesthesia plan was to use no                            sedation or anesthesia. Immediately prior to                            administration of medications, the patient was                            re-assessed for adequacy to receive sedatives. The                            heart rate, respiratory rate, oxygen saturations,                            blood pressure, adequacy of pulmonary ventilation,                            and response to care were monitored throughout the                            procedure. The physical status of the patient was                            re-assessed after the procedure.                           After obtaining informed consent, the colonoscope                            was passed under direct vision. Throughout the                            procedure, the patient's blood pressure, pulse, and  oxygen saturations were monitored continuously. The                            EC-[id number] ([id number]) scope was introduced through                            the anus and advanced to the 4 cm into the ileum.                            The colonoscopy was performed without difficulty.                            The patient tolerated the procedure well. The                            quality of the bowel preparation was excellent. Scope In: 1:40:06 PM Scope Out: 2:09:57 PM Scope Withdrawal Time: 0 hours 23 minutes 36 seconds  Total Procedure Duration: 0 hours 29 minutes 51 seconds  Findings:      The perianal and digital rectal examinations were normal.      A 6 mm polyp was found in the rectum (benign-appearing lesion). The       polyp was sessile. The polyp was removed with a cold snare. Resection       and retrieval were complete.      A few scattered small-mouthed diverticula were found in the sigmoid       colon and ascending colon.      An area of mildly  congested mucosa (patchy colitis) was found in the       sigmoid colon and in the descending colon. There were no erosions or       ulcers. Rectal mucosa was normal. The remaining colonic mucosa was       normal. Biopsies were taken with a cold forceps for histology from the       right side of the colon and from the left side of the colon and sent in       separate containers.      The terminal ileum appeared normal. Biopsies were taken with a cold       forceps for histology.      Minimal internal hemorrhoids were noted. Impression:               - Resolving acute left-sided colitis (likely                            infectious, doubt inflammatory).                           - Small rectal polyp s/p polypectomy.                           - Minimal pancolonic diverticulosis. Moderate Sedation:      NO SEDATION AT REQUEST OF PATIENT Recommendation:           - Return patient to [facility] ward for ongoing care.                           - Resume previous  diet.                           - Continue present medications.                           - Await pathology results.                           - FU if still with problems. Procedure Code(s):        --- Professional ---                           [phone], Colonoscopy, flexible; with removal of                            tumor(s), polyp(s), or other lesion(s) by snare                            technique                           45380, 59, Colonoscopy, flexible; with biopsy,                            single or multiple Diagnosis Code(s):        --- Professional ---                           K62.1, Rectal polyp                           K63.89, Other specified diseases of intestine                           K92.2, Gastrointestinal hemorrhage, unspecified                           K57.30, Diverticulosis of large intestine without                            perforation or abscess without bleeding CPT copyright 2017 American Medical  Association. All rights reserved. The codes documented in this report are preliminary and upon coder review may  be revised to meet current compliance requirements. Lynann Bologna MD, MD 12/04/2017 2:25:42 PM This report has been signed electronically. Number of Addenda: 0

## 2017-12-04 NOTE — Progress Notes (Signed)
PROGRESS NOTE    Alexander Michael  YQM:[id number] DOB: 09-02-1985 DOA: 12/02/2017 PCP: Lewis Moccasin, MD    Brief Narrative: Alexander Michael IIIis a 32 y.o.malewho denies any significant past medical history, now presenting to the emergency department for evaluation of bloody diarrhea, abdominal pain, and fevers going on for 3 weeks. He was found to have colitis. GI consulted and he underwent colonoscopy showing colitis, and some diverticula.    Assessment & Plan:   Principal Problem:   Colitis   Probably infectious colitis:  Improving,  Bleeding has improved.  Advance diet to soft diet today.  Gently hydrate.  Afebrile, improving leukocytosis.     Some tingling of the 4th and 5 th fingers associated with numbness: Appears to have resolved.  Ct neck does not show any acute abn.  b12 levels wnl.       DVT prophylaxis: (scd's Code Status: full code.  Family Communication: mother at bedside.  Disposition Plan: home in 1 to 2 days.    Consultants:   Gastroenterology.   Procedures: colonoscopy.    Antimicrobials: ciprofloxacin.    Subjective: Tingling in the fingers has resolved. Bleeding improved. Still havingsome epigastric pain.   Objective: Vitals:   12/04/17 1400 12/04/17 1405 12/04/17 1410 12/04/17 1415  BP:  (!) 181/97 (!) 169/106 (!) 179/95  Pulse: 77 78 76 75  Resp: (!) 23 (!) 24 (!) 23 20  Temp:      TempSrc:      SpO2: 100% 100% 100%   Weight:      Height:        Intake/Output Summary (Last 24 hours) at 12/04/2017 1424 Last data filed at 12/04/2017 1058 Gross per 24 hour  Intake 2830 ml  Output 6 ml  Net 2824 ml   Filed Weights   12/03/17 0557  Weight: 129.7 kg (286 lb)    Examination:  General exam: Appears calm and comfortable  Respiratory system: Clear to auscultation. Respiratory effort normal. Cardiovascular system: S1 & S2 heard, RRR. No JVD, murmurs, rubs, gallops or clicks. No pedal edema. Gastrointestinal  system: Abdomen is nondistended, soft and nontender. No organomegaly or masses felt. Normal bowel sounds heard. Central nervous system: Alert and oriented. No focal neurological deficits. Extremities: Symmetric 5 x 5 power. Skin: No rashes, lesions or ulcers Psychiatry: Judgement and insight appear normal. Mood & affect appropriate.     Data Reviewed: I have personally reviewed following labs and imaging studies  CBC: Recent Labs  Lab 12/02/17 2338 12/03/17 0552  WBC 22.0* 15.3*  HGB 15.1 13.5  HCT 43.7 40.1  MCV 87.2 87.4  PLT 252 198   Basic Metabolic Panel: Recent Labs  Lab 12/02/17 2338 12/03/17 0552  NA 137 139  K 3.6 3.7  CL 103 109  CO2 23 21*  GLUCOSE 115* 102*  BUN 12 9  CREATININE 1.22 0.94  CALCIUM 9.0 8.1*   GFR: Estimated Creatinine Clearance: 167.1 mL/min (by C-G formula based on SCr of 0.94 mg/dL). Liver Function Tests: Recent Labs  Lab 12/02/17 2338 12/03/17 0552  AST 33 25  ALT 49 37  ALKPHOS 69 56  BILITOT 1.1 1.0  PROT 7.4 6.3*  ALBUMIN 4.2 3.4*   No results for input(s): LIPASE, AMYLASE in the last 168 hours. No results for input(s): AMMONIA in the last 168 hours. Coagulation Profile: No results for input(s): INR, PROTIME in the last 168 hours. Cardiac Enzymes: No results for input(s): CKTOTAL, CKMB, CKMBINDEX, TROPONINI in the last 168  hours. BNP (last 3 results) No results for input(s): PROBNP in the last 8760 hours. HbA1C: No results for input(s): HGBA1C in the last 72 hours. CBG: No results for input(s): GLUCAP in the last 168 hours. Lipid Profile: No results for input(s): CHOL, HDL, LDLCALC, TRIG, CHOLHDL, LDLDIRECT in the last 72 hours. Thyroid Function Tests: No results for input(s): TSH, T4TOTAL, FREET4, T3FREE, THYROIDAB in the last 72 hours. Anemia Panel: Recent Labs    12/04/17 1007  VITAMINB12 311   Sepsis Labs: Recent Labs  Lab 12/02/17 2338  LATICACIDVEN 0.88    Recent Results (from the past 240 hour(s))    Urine culture     Status: None   Collection Time: 12/02/17 11:38 PM  Result Value Ref Range Status   Specimen Description   Final    URINE, CLEAN CATCH Performed at [facility], [address] [place], [place] [address]    Special Requests   Final    NONE Performed at [facility], [address] [place], [place] [address]    Culture   Final    NO GROWTH Performed at [facility], [address] [place], [place] [address]    Report Status 12/04/2017 FINAL  Final  Gastrointestinal Panel by PCR , Stool     Status: None   Collection Time: 12/03/17 12:15 AM  Result Value Ref Range Status   Campylobacter species NOT DETECTED NOT DETECTED Final   Plesimonas shigelloides NOT DETECTED NOT DETECTED Final   Salmonella species NOT DETECTED NOT DETECTED Final   Yersinia enterocolitica NOT DETECTED NOT DETECTED Final   Vibrio species NOT DETECTED NOT DETECTED Final   Vibrio cholerae NOT DETECTED NOT DETECTED Final   Enteroaggregative E coli (EAEC) NOT DETECTED NOT DETECTED Final   Enteropathogenic E coli (EPEC) NOT DETECTED NOT DETECTED Final   Enterotoxigenic E coli (ETEC) NOT DETECTED NOT DETECTED Final   Shiga like toxin producing E coli (STEC) NOT DETECTED NOT DETECTED Final   Shigella/Enteroinvasive E coli (EIEC) NOT DETECTED NOT DETECTED Final   Cryptosporidium NOT DETECTED NOT DETECTED Final   Cyclospora cayetanensis NOT DETECTED NOT DETECTED Final   Entamoeba histolytica NOT DETECTED NOT DETECTED Final   Giardia lamblia NOT DETECTED NOT DETECTED Final   Adenovirus F40/41 NOT DETECTED NOT DETECTED Final   Astrovirus NOT DETECTED NOT DETECTED Final   Norovirus GI/GII NOT DETECTED NOT DETECTED Final   Rotavirus A NOT DETECTED NOT DETECTED Final   Sapovirus (I, II, IV, and V) NOT DETECTED NOT DETECTED Final    Comment: Performed at [facility], [address] [place], [place] [address]  C difficile quick scan w PCR reflex      Status: None   Collection Time: 12/03/17  5:25 AM  Result Value Ref Range Status   C Diff antigen NEGATIVE NEGATIVE Final   C Diff toxin NEGATIVE NEGATIVE Final   C Diff interpretation No C. difficile detected.  Final    Comment: Performed at [facility], [address] [place], [place] [address]         Radiology Studies: Dg Chest 2 View  Result Date: 12/02/2017 CLINICAL DATA:  Chest pain EXAM: CHEST - 2 VIEW COMPARISON:  None. FINDINGS: Shallow lung inflation. The heart size and mediastinal contours are within normal limits. Both lungs are clear. The visualized skeletal structures are unremarkable. IMPRESSION: Shallow lung inflation without active cardiopulmonary disease. Electronically Signed   By: Deatra RobinsonKevin  Herman M.D.   On:  12/02/2017 23:00   Ct Cervical Spine Wo Contrast  Result Date: 12/03/2017 CLINICAL DATA:  RIGHT hand numbness and tingling. Bilateral hand paresthesias after chest pain yesterday. Assess radiculopathy. EXAM: CT CERVICAL SPINE WITHOUT CONTRAST TECHNIQUE: Multidetector CT imaging of the cervical spine was performed without intravenous contrast. Multiplanar CT image reconstructions were also generated. COMPARISON:  None. FINDINGS: ALIGNMENT: Broad reversed lordosis.  Vertebral bodies in alignment. SKULL BASE AND VERTEBRAE: Cervical vertebral bodies and posterior elements are intact. Mildly hypoplastic bilateral C6 facets. Intervertebral disc heights preserved. No destructive bony lesions. C1-2 articulation maintained. SOFT TISSUES AND SPINAL CANAL: Normal. DISC LEVELS: No significant osseous canal stenosis or neural foraminal narrowing. UPPER CHEST: Lung apices are clear. OTHER: None. IMPRESSION: 1. No fracture, malalignment, canal stenosis or neural foraminal narrowing. Electronically Signed   By: Awilda Metro M.D.   On: 12/03/2017 13:56   Ct Abdomen Pelvis W Contrast  Result Date: 12/03/2017 CLINICAL DATA:  GI bleeding for 1 week. EXAM: CT  ABDOMEN AND PELVIS WITH CONTRAST TECHNIQUE: Multidetector CT imaging of the abdomen and pelvis was performed using the standard protocol following bolus administration of intravenous contrast. CONTRAST:  ISOVUE-300 IOPAMIDOL (ISOVUE-300) INJECTION 61% COMPARISON:  CT 9 days prior 11/24/2017 FINDINGS: Lower chest: Lung bases are clear. Hepatobiliary: No focal hepatic lesion. Gallbladder physiologically distended, no calcified stone. No biliary dilatation. Pancreas: No ductal dilatation or inflammation. Spleen: Upper normal in size spanning 13 cm.  No focal abnormality. Adrenals/Urinary Tract: Normal adrenal glands. No hydronephrosis or perinephric edema. Homogeneous renal enhancement. Urinary bladder is partially distended without wall thickening. Stomach/Bowel: Again seen colonic wall thickening involving the descending and sigmoid colon, as well as rectum. Equivocal wall thickening of the ascending and transverse colon, not well assessed given lack of enteric contrast on the current exam. Minimal pericolonic edema distally, for example image 80 series 2. No pericolonic or perirectal abscess. Normal appendix. Normal terminal ileum. No small bowel inflammation, evidence of wall thickening or obstruction. Stomach is within normal limits. Vascular/Lymphatic: No enlarged abdominal or pelvic lymph nodes. Few prominent central mesenteric nodes are not enlarged by size criteria. No acute vascular finding. Reproductive: Prostate is unremarkable. Other: No free air, free fluid, or intra-abdominal fluid collection. Musculoskeletal: There are no acute or suspicious osseous abnormalities. IMPRESSION: Persistent rectal wall thickening. Additionally there is mild wall thickening of the descending and sigmoid colon, and equivocal wall thickening of the more proximal colon. Findings suggest mild colitis/proctocolitis, may be infectious or inflammatory. No abscess or perforation. Electronically Signed   By: Rubye Oaks  M.D.   On: 12/03/2017 02:10        Scheduled Meds: . [MAR Hold] feeding supplement  1 Container Oral TID BM   Continuous Infusions: . 0.9 % NaCl with KCl 20 mEq / L 100 mL/hr at 12/03/17 1342  . [MAR Hold] ciprofloxacin Stopped (12/04/17 1210)  . [MAR Hold] metronidazole Stopped (12/04/17 0634)     LOS: 1 day    Time spent: 35 minutes.     Kathlen Mody, MD [facility] Pager [phone]  If 7PM-7AM, please contact night-coverage [email or web address] Password [facility] 12/04/2017, 2:24 PM

## 2017-12-04 NOTE — Progress Notes (Signed)
Patient returned from colonscopy. In room getting patient's vital signs and he expressed desire to be discharged by 5pm or else he was going to "cause problems" and we would "need to call security". Paged Dr Blake DivineAkula to make her aware of patient's agitation.

## 2017-12-05 ENCOUNTER — Encounter (HOSPITAL_COMMUNITY): Payer: Self-pay | Admitting: Gastroenterology

## 2017-12-07 NOTE — Discharge Summary (Signed)
Physician Discharge Summary  Alexander Michael ZOX:[id number] DOB: 08-02-1986 DOA: 12/02/2017  PCP: Lewis Moccasin, MD  Admit date: 12/02/2017 Discharge date: 12/04/2017  Admitted From: Home.  Disposition:  Home.   Recommendations for Outpatient Follow-up:  1. Follow up with PCP in 1-2 weeks 2. Please obtain BMP/CBC in one week Please follow up with gastroenterology regarding the biopsy results   Discharge Condition:stable.  CODE STATUS:full code.  Diet recommendation: Heart healthy.  Brief/Interim Summary: Alexander Shih IIIis a 31 y.o.malewho denies any significant past medical history, now presenting to the emergency department for evaluation of bloody diarrhea, abdominal pain, and feversgoing on for 3 weeks. He was found to have colitis. GI consulted and he underwent colonoscopy showing colitis, and some diverticula.     Discharge Diagnoses:  Principal Problem:   Colitis obesity.    Probably infectious colitis:  Improving, . Gastroenterology consulted as he had these symptoms for almost 3 weeks now.  He underwent colonoscopy showing mild colitis.  Bleeding has improved. Gi pathogen pcr and c diff is negative.  Advance diet to soft diet today.  Gently hydrate.  Afebrile, improving leukocytosis.  Recommend outpatient follow up with gastroenterology.    Some tingling of the 4th and 5 th fingers associated with numbness: Appears to have resolved.  Ct neck does not show any acute abn.  b12 levels wnl.    Sepsis ruled out.   Obesity  Outpatient follow up with PCP for diet control and weight loss.     Discharge Instructions  Discharge Instructions    Diet - low sodium heart healthy   Complete by:  As directed    Discharge instructions   Complete by:  As directed    PLEASE FOLLOW UP WITH GASTROENTEROLOGY REGARDING THE BIOPSY RESULTS IN ONE WEEK.     Allergies as of 12/04/2017   No Known Allergies     Medication List    STOP taking these  medications   AMBULATORY NON FORMULARY MEDICATION     TAKE these medications   ciprofloxacin 500 MG tablet Commonly known as:  CIPRO Take 1 tablet (500 mg total) by mouth 2 (two) times daily.   metroNIDAZOLE 500 MG tablet Commonly known as:  FLAGYL Take 1 tablet (500 mg total) by mouth 3 (three) times daily for 10 days.      Follow-up Information    Lewis Moccasin, MD. Schedule an appointment as soon as possible for a visit in 1 week(s).   Specialty:  Family Medicine Contact information: [address] [place] [address] [phone]          No Known Allergies  Consultations:  gastroenterology   Procedures/Studies: Dg Chest 2 View  Result Date: 12/02/2017 CLINICAL DATA:  Chest pain EXAM: CHEST - 2 VIEW COMPARISON:  None. FINDINGS: Shallow lung inflation. The heart size and mediastinal contours are within normal limits. Both lungs are clear. The visualized skeletal structures are unremarkable. IMPRESSION: Shallow lung inflation without active cardiopulmonary disease. Electronically Signed   By: Deatra Robinson M.D.   On: 12/02/2017 23:00   Ct Cervical Spine Wo Contrast  Result Date: 12/03/2017 CLINICAL DATA:  RIGHT hand numbness and tingling. Bilateral hand paresthesias after chest pain yesterday. Assess radiculopathy. EXAM: CT CERVICAL SPINE WITHOUT CONTRAST TECHNIQUE: Multidetector CT imaging of the cervical spine was performed without intravenous contrast. Multiplanar CT image reconstructions were also generated. COMPARISON:  None. FINDINGS: ALIGNMENT: Broad reversed lordosis.  Vertebral bodies in alignment. SKULL BASE AND VERTEBRAE:  Cervical vertebral bodies and posterior elements are intact. Mildly hypoplastic bilateral C6 facets. Intervertebral disc heights preserved. No destructive bony lesions. C1-2 articulation maintained. SOFT TISSUES AND SPINAL CANAL: Normal. DISC LEVELS: No significant osseous canal stenosis or neural foraminal narrowing. UPPER CHEST:  Lung apices are clear. OTHER: None. IMPRESSION: 1. No fracture, malalignment, canal stenosis or neural foraminal narrowing. Electronically Signed   By: Awilda Metro M.D.   On: 12/03/2017 13:56   Ct Abdomen Pelvis W Contrast  Result Date: 12/03/2017 CLINICAL DATA:  GI bleeding for 1 week. EXAM: CT ABDOMEN AND PELVIS WITH CONTRAST TECHNIQUE: Multidetector CT imaging of the abdomen and pelvis was performed using the standard protocol following bolus administration of intravenous contrast. CONTRAST:  ISOVUE-300 IOPAMIDOL (ISOVUE-300) INJECTION 61% COMPARISON:  CT 9 days prior 11/24/2017 FINDINGS: Lower chest: Lung bases are clear. Hepatobiliary: No focal hepatic lesion. Gallbladder physiologically distended, no calcified stone. No biliary dilatation. Pancreas: No ductal dilatation or inflammation. Spleen: Upper normal in size spanning 13 cm.  No focal abnormality. Adrenals/Urinary Tract: Normal adrenal glands. No hydronephrosis or perinephric edema. Homogeneous renal enhancement. Urinary bladder is partially distended without wall thickening. Stomach/Bowel: Again seen colonic wall thickening involving the descending and sigmoid colon, as well as rectum. Equivocal wall thickening of the ascending and transverse colon, not well assessed given lack of enteric contrast on the current exam. Minimal pericolonic edema distally, for example image 80 series 2. No pericolonic or perirectal abscess. Normal appendix. Normal terminal ileum. No small bowel inflammation, evidence of wall thickening or obstruction. Stomach is within normal limits. Vascular/Lymphatic: No enlarged abdominal or pelvic lymph nodes. Few prominent central mesenteric nodes are not enlarged by size criteria. No acute vascular finding. Reproductive: Prostate is unremarkable. Other: No free air, free fluid, or intra-abdominal fluid collection. Musculoskeletal: There are no acute or suspicious osseous abnormalities. IMPRESSION: Persistent rectal  wall thickening. Additionally there is mild wall thickening of the descending and sigmoid colon, and equivocal wall thickening of the more proximal colon. Findings suggest mild colitis/proctocolitis, may be infectious or inflammatory. No abscess or perforation. Electronically Signed   By: Rubye Oaks M.D.   On: 12/03/2017 02:10   Ct Abdomen Pelvis W Contrast  Result Date: 11/24/2017 CLINICAL DATA:  Bright red stool x1 week, nausea, evaluate for diverticulitis EXAM: CT ABDOMEN AND PELVIS WITH CONTRAST TECHNIQUE: Multidetector CT imaging of the abdomen and pelvis was performed using the standard protocol following bolus administration of intravenous contrast. CONTRAST:  ISOVUE-300 IOPAMIDOL (ISOVUE-300) INJECTION 61% COMPARISON:  08/21/2014 FINDINGS: Lower chest: Lung bases are clear. Hepatobiliary: Liver is within normal limits. Gallbladder is unremarkable. No intrahepatic or extrahepatic ductal dilatation. Pancreas: Within normal limits. Spleen: Within normal limits. Adrenals/Urinary Tract: Adrenal glands are within normal limits. Kidneys are within normal limits.  No hydronephrosis. Bladder is within normal limits. Stomach/Bowel: Stomach is within normal limits. No evidence of bowel obstruction. Normal appendix (series 2/image 33). Mild left colonic wall thickening, most pronounced in the rectum (series 2/images 93 and 95), suggesting infectious/inflammatory proctitis. No evidence of diverticulitis. No drainable fluid collection/abscess.  No free air. Vascular/Lymphatic: No evidence of abdominal aortic aneurysm. No suspicious abdominopelvic lymphadenopathy. Reproductive: Prostate is unremarkable. Other: No abdominopelvic ascites. Musculoskeletal: Visualized osseous structures are within normal limits. IMPRESSION: Mild wall thickening involving the rectum, suggesting infectious/inflammatory proctitis. No drainable fluid collection/abscess.  No free air. No evidence of diverticulitis. Electronically  Signed   By: Charline Bills M.D.   On: 11/24/2017 15:10       Subjective: No  new complaints.   Discharge Exam: Vitals:   12/04/17 1415 12/04/17 1450  BP: (!) 179/95 (!) 154/83  Pulse: 75 68  Resp: 20   Temp:  98.2 F (36.8 C)  SpO2:  98%   Vitals:   12/04/17 1405 12/04/17 1410 12/04/17 1415 12/04/17 1450  BP: (!) 181/97 (!) 169/106 (!) 179/95 (!) 154/83  Pulse: 78 76 75 68  Resp: (!) 24 (!) 23 20   Temp:    98.2 F (36.8 C)  TempSrc:    Oral  SpO2: 100% 100%  98%  Weight:      Height:        General: Pt is alert, awake, not in acute distress Cardiovascular: RRR, S1/S2 +, no rubs, no gallops Respiratory: CTA bilaterally, no wheezing, no rhonchi Abdominal: Soft, NT, ND, bowel sounds + Extremities: no edema, no cyanosis    The results of significant diagnostics from this hospitalization (including imaging, microbiology, ancillary and laboratory) are listed below for reference.     Microbiology: Recent Results (from the past 240 hour(s))  Blood culture (routine x 2)     Status: None (Preliminary result)   Collection Time: 12/02/17 11:38 PM  Result Value Ref Range Status   Specimen Description   Final    BLOOD LEFT ANTECUBITAL Performed at [facility], [address], [place], [place] [address]    Special Requests   Final    BOTTLES DRAWN AEROBIC AND ANAEROBIC Blood Culture results may not be optimal due to an excessive volume of blood received in culture bottles Performed at [facility], [address] [place], [place] [address]    Culture   Final    NO GROWTH 3 DAYS Performed at [facility], [address], [place], [place] [address]    Report Status PENDING  Incomplete  Blood culture (routine x 2)     Status: None (Preliminary result)   Collection Time: 12/02/17 11:38 PM  Result Value Ref Range Status   Specimen Description   Final    BLOOD RIGHT ANTECUBITAL Performed at [facility],  [address], [place], [place] [address]    Special Requests   Final    BOTTLES DRAWN AEROBIC AND ANAEROBIC Blood Culture adequate volume Performed at [facility], [address] [place], [place] [address]    Culture   Final    NO GROWTH 3 DAYS Performed at [facility], [address], [place], [place] [address]    Report Status PENDING  Incomplete  Urine culture     Status: None   Collection Time: 12/02/17 11:38 PM  Result Value Ref Range Status   Specimen Description   Final    URINE, CLEAN CATCH Performed at [facility], [address] [place], [place] [address]    Special Requests   Final    NONE Performed at [facility], [address] [place], [place] [address]    Culture   Final    NO GROWTH Performed at [facility], [address], [place], [place] [address]    Report Status 12/04/2017 FINAL  Final  Gastrointestinal Panel by PCR , Stool     Status: None   Collection Time: 12/03/17 12:15 AM  Result Value Ref Range Status   Campylobacter species NOT DETECTED NOT DETECTED Final   Plesimonas shigelloides NOT DETECTED NOT DETECTED Final   Salmonella species NOT DETECTED NOT DETECTED Final  Yersinia enterocolitica NOT DETECTED NOT DETECTED Final   Vibrio species NOT DETECTED NOT DETECTED Final   Vibrio cholerae NOT DETECTED NOT DETECTED Final   Enteroaggregative E coli (EAEC) NOT DETECTED NOT DETECTED Final   Enteropathogenic E coli (EPEC) NOT DETECTED NOT DETECTED Final   Enterotoxigenic E coli (ETEC) NOT DETECTED NOT DETECTED Final   Shiga like toxin producing E coli (STEC) NOT DETECTED NOT DETECTED Final   Shigella/Enteroinvasive E coli (EIEC) NOT DETECTED NOT DETECTED Final   Cryptosporidium NOT DETECTED NOT DETECTED Final   Cyclospora cayetanensis NOT DETECTED NOT DETECTED Final   Entamoeba histolytica NOT DETECTED NOT DETECTED Final   Giardia lamblia NOT DETECTED NOT DETECTED Final    Adenovirus F40/41 NOT DETECTED NOT DETECTED Final   Astrovirus NOT DETECTED NOT DETECTED Final   Norovirus GI/GII NOT DETECTED NOT DETECTED Final   Rotavirus A NOT DETECTED NOT DETECTED Final   Sapovirus (I, II, IV, and V) NOT DETECTED NOT DETECTED Final    Comment: Performed at [facility], [address] [place], [place] [address]  C difficile quick scan w PCR reflex     Status: None   Collection Time: 12/03/17  5:25 AM  Result Value Ref Range Status   C Diff antigen NEGATIVE NEGATIVE Final   C Diff toxin NEGATIVE NEGATIVE Final   C Diff interpretation No C. difficile detected.  Final    Comment: Performed at [facility], [address], [place], [place] [address]     Labs: BNP (last 3 results) No results for input(s): BNP in the last 8760 hours. Basic Metabolic Panel: Recent Labs  Lab 12/02/17 2338 12/03/17 0552  NA 137 139  K 3.6 3.7  CL 103 109  CO2 23 21*  GLUCOSE 115* 102*  BUN 12 9  CREATININE 1.22 0.94  CALCIUM 9.0 8.1*   Liver Function Tests: Recent Labs  Lab 12/02/17 2338 12/03/17 0552  AST 33 25  ALT 49 37  ALKPHOS 69 56  BILITOT 1.1 1.0  PROT 7.4 6.3*  ALBUMIN 4.2 3.4*   No results for input(s): LIPASE, AMYLASE in the last 168 hours. No results for input(s): AMMONIA in the last 168 hours. CBC: Recent Labs  Lab 12/02/17 2338 12/03/17 0552  WBC 22.0* 15.3*  HGB 15.1 13.5  HCT 43.7 40.1  MCV 87.2 87.4  PLT 252 198   Cardiac Enzymes: No results for input(s): CKTOTAL, CKMB, CKMBINDEX, TROPONINI in the last 168 hours. BNP: Invalid input(s): POCBNP CBG: No results for input(s): GLUCAP in the last 168 hours. D-Dimer No results for input(s): DDIMER in the last 72 hours. Hgb A1c No results for input(s): HGBA1C in the last 72 hours. Lipid Profile No results for input(s): CHOL, HDL, LDLCALC, TRIG, CHOLHDL, LDLDIRECT in the last 72 hours. Thyroid function studies No results for input(s): TSH, T4TOTAL, T3FREE,  THYROIDAB in the last 72 hours.  Invalid input(s): FREET3 Anemia work up Recent Labs    12/04/17 1007  VITAMINB12 311   Urinalysis    Component Value Date/Time   COLORURINE YELLOW 12/02/2017 2341   APPEARANCEUR CLEAR 12/02/2017 2341   LABSPEC 1.021 12/02/2017 2341   PHURINE 5.0 12/02/2017 2341   GLUCOSEU NEGATIVE 12/02/2017 2341   HGBUR SMALL (A) 12/02/2017 2341   BILIRUBINUR NEGATIVE 12/02/2017 2341   KETONESUR NEGATIVE 12/02/2017 2341   PROTEINUR NEGATIVE 12/02/2017 2341   UROBILINOGEN 0.2 08/21/2014 1102   NITRITE NEGATIVE 12/02/2017 2341   LEUKOCYTESUR NEGATIVE 12/02/2017 2341   Sepsis Labs Invalid input(s):  PROCALCITONIN,  WBC,  LACTICIDVEN Microbiology Recent Results (from the past 240 hour(s))  Blood culture (routine x 2)     Status: None (Preliminary result)   Collection Time: 12/02/17 11:38 PM  Result Value Ref Range Status   Specimen Description   Final    BLOOD LEFT ANTECUBITAL Performed at [facility], [address] [place], [place] [address]    Special Requests   Final    BOTTLES DRAWN AEROBIC AND ANAEROBIC Blood Culture results may not be optimal due to an excessive volume of blood received in culture bottles Performed at [facility], [address] [place], [place] [address]    Culture   Final    NO GROWTH 3 DAYS Performed at [facility], [address] [place], [place] [address]    Report Status PENDING  Incomplete  Blood culture (routine x 2)     Status: None (Preliminary result)   Collection Time: 12/02/17 11:38 PM  Result Value Ref Range Status   Specimen Description   Final    BLOOD RIGHT ANTECUBITAL Performed at [facility], [address] [place], [place] [address]    Special Requests   Final    BOTTLES DRAWN AEROBIC AND ANAEROBIC Blood Culture adequate volume Performed at [facility], [address] [place], [place] [address]    Culture   Final     NO GROWTH 3 DAYS Performed at [facility], [address] [place], [place] [address]    Report Status PENDING  Incomplete  Urine culture     Status: None   Collection Time: 12/02/17 11:38 PM  Result Value Ref Range Status   Specimen Description   Final    URINE, CLEAN CATCH Performed at [facility], [address] [place], [place] [address]    Special Requests   Final    NONE Performed at [facility], [address] [place], [place] [address]    Culture   Final    NO GROWTH Performed at [facility], [address] [place], [place] [address]    Report Status 12/04/2017 FINAL  Final  Gastrointestinal Panel by PCR , Stool     Status: None   Collection Time: 12/03/17 12:15 AM  Result Value Ref Range Status   Campylobacter species NOT DETECTED NOT DETECTED Final   Plesimonas shigelloides NOT DETECTED NOT DETECTED Final   Salmonella species NOT DETECTED NOT DETECTED Final   Yersinia enterocolitica NOT DETECTED NOT DETECTED Final   Vibrio species NOT DETECTED NOT DETECTED Final   Vibrio cholerae NOT DETECTED NOT DETECTED Final   Enteroaggregative E coli (EAEC) NOT DETECTED NOT DETECTED Final   Enteropathogenic E coli (EPEC) NOT DETECTED NOT DETECTED Final   Enterotoxigenic E coli (ETEC) NOT DETECTED NOT DETECTED Final   Shiga like toxin producing E coli (STEC) NOT DETECTED NOT DETECTED Final   Shigella/Enteroinvasive E coli (EIEC) NOT DETECTED NOT DETECTED Final   Cryptosporidium NOT DETECTED NOT DETECTED Final   Cyclospora cayetanensis NOT DETECTED NOT DETECTED Final   Entamoeba histolytica NOT DETECTED NOT DETECTED Final   Giardia lamblia NOT DETECTED NOT DETECTED Final   Adenovirus F40/41 NOT DETECTED NOT DETECTED Final   Astrovirus NOT DETECTED NOT DETECTED Final   Norovirus GI/GII NOT DETECTED NOT DETECTED Final   Rotavirus A NOT DETECTED NOT DETECTED Final   Sapovirus (I, II, IV, and V) NOT DETECTED NOT DETECTED Final  Comment: Performed at [facility], [address] [place], [place] [address]  C difficile quick scan w PCR reflex     Status: None   Collection Time: 12/03/17  5:25 AM  Result Value Ref Range Status   C Diff antigen NEGATIVE NEGATIVE Final   C Diff toxin NEGATIVE NEGATIVE Final   C Diff interpretation No C. difficile detected.  Final    Comment: Performed at [facility], [address], [place], [place] [address]     Time coordinating discharge: 35 minutes  SIGNED:   Kathlen Mody, MD  [facility] 12/07/2017, 8:40 AM Pager   If 7PM-7AM, please contact night-coverage [email or web address] Password [email or web address]

## 2017-12-08 LAB — CULTURE, BLOOD (ROUTINE X 2)
CULTURE: NO GROWTH
Culture: NO GROWTH
Special Requests: ADEQUATE

## 2017-12-11 ENCOUNTER — Telehealth: Payer: Self-pay

## 2017-12-11 NOTE — Telephone Encounter (Signed)
I gave him the pathology result.  He reports that he is feeling better, almost back to baseline.

## 2017-12-12 DIAGNOSIS — K5792 Diverticulitis of intestine, part unspecified, without perforation or abscess without bleeding: Secondary | ICD-10-CM | POA: Diagnosis not present

## 2017-12-15 NOTE — Telephone Encounter (Signed)
Thanks  [employer]

## 2017-12-17 ENCOUNTER — Ambulatory Visit: Payer: BLUE CROSS/BLUE SHIELD | Admitting: Physician Assistant

## 2017-12-17 DIAGNOSIS — K529 Noninfective gastroenteritis and colitis, unspecified: Secondary | ICD-10-CM | POA: Diagnosis not present

## 2017-12-17 DIAGNOSIS — Z6837 Body mass index (BMI) 37.0-37.9, adult: Secondary | ICD-10-CM | POA: Diagnosis not present

## 2018-01-01 ENCOUNTER — Ambulatory Visit: Payer: BLUE CROSS/BLUE SHIELD | Admitting: Physician Assistant

## 2018-01-07 DIAGNOSIS — H903 Sensorineural hearing loss, bilateral: Secondary | ICD-10-CM | POA: Diagnosis not present

## 2018-01-28 DIAGNOSIS — M222X2 Patellofemoral disorders, left knee: Secondary | ICD-10-CM | POA: Diagnosis not present

## 2018-01-28 DIAGNOSIS — M25552 Pain in left hip: Secondary | ICD-10-CM | POA: Diagnosis not present

## 2018-01-30 DIAGNOSIS — S7002XA Contusion of left hip, initial encounter: Secondary | ICD-10-CM | POA: Diagnosis not present

## 2018-01-30 DIAGNOSIS — M2392 Unspecified internal derangement of left knee: Secondary | ICD-10-CM | POA: Diagnosis not present

## 2018-01-30 DIAGNOSIS — S29019A Strain of muscle and tendon of unspecified wall of thorax, initial encounter: Secondary | ICD-10-CM | POA: Diagnosis not present

## 2018-01-31 DIAGNOSIS — M25562 Pain in left knee: Secondary | ICD-10-CM | POA: Diagnosis not present

## 2018-02-02 DIAGNOSIS — S83512A Sprain of anterior cruciate ligament of left knee, initial encounter: Secondary | ICD-10-CM | POA: Diagnosis not present

## 2018-02-02 DIAGNOSIS — M222X2 Patellofemoral disorders, left knee: Secondary | ICD-10-CM | POA: Diagnosis not present

## 2018-02-02 DIAGNOSIS — M1712 Unilateral primary osteoarthritis, left knee: Secondary | ICD-10-CM | POA: Diagnosis not present

## 2018-02-02 DIAGNOSIS — S83242A Other tear of medial meniscus, current injury, left knee, initial encounter: Secondary | ICD-10-CM | POA: Diagnosis not present

## 2018-02-10 DIAGNOSIS — M222X2 Patellofemoral disorders, left knee: Secondary | ICD-10-CM | POA: Diagnosis not present

## 2018-02-14 DIAGNOSIS — M25561 Pain in right knee: Secondary | ICD-10-CM | POA: Diagnosis not present

## 2018-02-17 DIAGNOSIS — M25561 Pain in right knee: Secondary | ICD-10-CM | POA: Diagnosis not present

## 2018-02-25 DIAGNOSIS — R079 Chest pain, unspecified: Secondary | ICD-10-CM | POA: Diagnosis not present

## 2018-02-25 DIAGNOSIS — Z6838 Body mass index (BMI) 38.0-38.9, adult: Secondary | ICD-10-CM | POA: Diagnosis not present

## 2018-02-25 DIAGNOSIS — R221 Localized swelling, mass and lump, neck: Secondary | ICD-10-CM | POA: Diagnosis not present

## 2018-02-25 DIAGNOSIS — R253 Fasciculation: Secondary | ICD-10-CM | POA: Diagnosis not present

## 2018-03-02 ENCOUNTER — Ambulatory Visit
Admission: RE | Admit: 2018-03-02 | Discharge: 2018-03-02 | Disposition: A | Discharge status: Home / Self Care | Payer: BLUE CROSS/BLUE SHIELD | Source: Ambulatory Visit | Attending: Family Medicine | Admitting: Family Medicine

## 2018-03-02 ENCOUNTER — Other Ambulatory Visit: Payer: Self-pay | Admitting: Family Medicine

## 2018-03-02 DIAGNOSIS — R079 Chest pain, unspecified: Secondary | ICD-10-CM

## 2018-03-02 DIAGNOSIS — R221 Localized swelling, mass and lump, neck: Secondary | ICD-10-CM | POA: Diagnosis not present

## 2018-03-02 DIAGNOSIS — R253 Fasciculation: Secondary | ICD-10-CM | POA: Diagnosis not present

## 2018-03-02 DIAGNOSIS — Z6838 Body mass index (BMI) 38.0-38.9, adult: Secondary | ICD-10-CM | POA: Diagnosis not present

## 2018-03-04 DIAGNOSIS — S83282A Other tear of lateral meniscus, current injury, left knee, initial encounter: Secondary | ICD-10-CM | POA: Diagnosis not present

## 2018-03-04 DIAGNOSIS — X58XXXA Exposure to other specified factors, initial encounter: Secondary | ICD-10-CM | POA: Diagnosis not present

## 2018-03-04 DIAGNOSIS — S83512A Sprain of anterior cruciate ligament of left knee, initial encounter: Secondary | ICD-10-CM | POA: Diagnosis not present

## 2018-03-04 DIAGNOSIS — Y9364 Activity, baseball: Secondary | ICD-10-CM | POA: Diagnosis not present

## 2018-03-04 DIAGNOSIS — M23222 Derangement of posterior horn of medial meniscus due to old tear or injury, left knee: Secondary | ICD-10-CM | POA: Diagnosis not present

## 2018-03-05 DIAGNOSIS — M25062 Hemarthrosis, left knee: Secondary | ICD-10-CM | POA: Diagnosis not present

## 2018-03-06 DIAGNOSIS — Z9889 Other specified postprocedural states: Secondary | ICD-10-CM | POA: Diagnosis not present

## 2018-03-09 DIAGNOSIS — M25662 Stiffness of left knee, not elsewhere classified: Secondary | ICD-10-CM | POA: Diagnosis not present

## 2018-03-09 DIAGNOSIS — M25562 Pain in left knee: Secondary | ICD-10-CM | POA: Diagnosis not present

## 2018-03-09 DIAGNOSIS — S83512D Sprain of anterior cruciate ligament of left knee, subsequent encounter: Secondary | ICD-10-CM | POA: Diagnosis not present

## 2018-03-10 DIAGNOSIS — M25562 Pain in left knee: Secondary | ICD-10-CM | POA: Diagnosis not present

## 2018-03-10 DIAGNOSIS — S83512D Sprain of anterior cruciate ligament of left knee, subsequent encounter: Secondary | ICD-10-CM | POA: Diagnosis not present

## 2018-03-10 DIAGNOSIS — M25662 Stiffness of left knee, not elsewhere classified: Secondary | ICD-10-CM | POA: Diagnosis not present

## 2018-03-11 DIAGNOSIS — M9902 Segmental and somatic dysfunction of thoracic region: Secondary | ICD-10-CM | POA: Diagnosis not present

## 2018-03-11 DIAGNOSIS — M546 Pain in thoracic spine: Secondary | ICD-10-CM | POA: Diagnosis not present

## 2018-03-11 DIAGNOSIS — M9908 Segmental and somatic dysfunction of rib cage: Secondary | ICD-10-CM | POA: Diagnosis not present

## 2018-03-13 DIAGNOSIS — M9902 Segmental and somatic dysfunction of thoracic region: Secondary | ICD-10-CM | POA: Diagnosis not present

## 2018-03-13 DIAGNOSIS — M9908 Segmental and somatic dysfunction of rib cage: Secondary | ICD-10-CM | POA: Diagnosis not present

## 2018-03-13 DIAGNOSIS — M9901 Segmental and somatic dysfunction of cervical region: Secondary | ICD-10-CM | POA: Diagnosis not present

## 2018-03-16 DIAGNOSIS — M25562 Pain in left knee: Secondary | ICD-10-CM | POA: Diagnosis not present

## 2018-03-18 DIAGNOSIS — R221 Localized swelling, mass and lump, neck: Secondary | ICD-10-CM | POA: Diagnosis not present

## 2018-03-18 DIAGNOSIS — R079 Chest pain, unspecified: Secondary | ICD-10-CM | POA: Diagnosis not present

## 2018-03-18 DIAGNOSIS — R253 Fasciculation: Secondary | ICD-10-CM | POA: Diagnosis not present

## 2018-03-18 DIAGNOSIS — Z6837 Body mass index (BMI) 37.0-37.9, adult: Secondary | ICD-10-CM | POA: Diagnosis not present

## 2018-03-19 DIAGNOSIS — M25562 Pain in left knee: Secondary | ICD-10-CM | POA: Diagnosis not present

## 2018-03-19 DIAGNOSIS — S83512D Sprain of anterior cruciate ligament of left knee, subsequent encounter: Secondary | ICD-10-CM | POA: Diagnosis not present

## 2018-03-19 DIAGNOSIS — M25662 Stiffness of left knee, not elsewhere classified: Secondary | ICD-10-CM | POA: Diagnosis not present

## 2018-03-20 DIAGNOSIS — M9902 Segmental and somatic dysfunction of thoracic region: Secondary | ICD-10-CM | POA: Diagnosis not present

## 2018-03-20 DIAGNOSIS — M9908 Segmental and somatic dysfunction of rib cage: Secondary | ICD-10-CM | POA: Diagnosis not present

## 2018-03-20 DIAGNOSIS — M9901 Segmental and somatic dysfunction of cervical region: Secondary | ICD-10-CM | POA: Diagnosis not present

## 2018-03-23 DIAGNOSIS — Z9889 Other specified postprocedural states: Secondary | ICD-10-CM | POA: Diagnosis not present

## 2018-03-24 DIAGNOSIS — M25662 Stiffness of left knee, not elsewhere classified: Secondary | ICD-10-CM | POA: Diagnosis not present

## 2018-03-24 DIAGNOSIS — M25562 Pain in left knee: Secondary | ICD-10-CM | POA: Diagnosis not present

## 2018-03-24 DIAGNOSIS — S83512D Sprain of anterior cruciate ligament of left knee, subsequent encounter: Secondary | ICD-10-CM | POA: Diagnosis not present

## 2018-03-30 DIAGNOSIS — R253 Fasciculation: Secondary | ICD-10-CM | POA: Diagnosis not present

## 2018-03-30 DIAGNOSIS — R079 Chest pain, unspecified: Secondary | ICD-10-CM | POA: Diagnosis not present

## 2018-03-30 DIAGNOSIS — I10 Essential (primary) hypertension: Secondary | ICD-10-CM | POA: Diagnosis not present

## 2018-03-30 DIAGNOSIS — R221 Localized swelling, mass and lump, neck: Secondary | ICD-10-CM | POA: Diagnosis not present

## 2018-03-31 DIAGNOSIS — M25562 Pain in left knee: Secondary | ICD-10-CM | POA: Diagnosis not present

## 2018-03-31 DIAGNOSIS — S83512D Sprain of anterior cruciate ligament of left knee, subsequent encounter: Secondary | ICD-10-CM | POA: Diagnosis not present

## 2018-03-31 DIAGNOSIS — M25662 Stiffness of left knee, not elsewhere classified: Secondary | ICD-10-CM | POA: Diagnosis not present

## 2018-04-02 DIAGNOSIS — M25662 Stiffness of left knee, not elsewhere classified: Secondary | ICD-10-CM | POA: Diagnosis not present

## 2018-04-02 DIAGNOSIS — M25562 Pain in left knee: Secondary | ICD-10-CM | POA: Diagnosis not present

## 2018-04-02 DIAGNOSIS — S83512D Sprain of anterior cruciate ligament of left knee, subsequent encounter: Secondary | ICD-10-CM | POA: Diagnosis not present

## 2018-04-03 DIAGNOSIS — M546 Pain in thoracic spine: Secondary | ICD-10-CM | POA: Diagnosis not present

## 2018-04-03 DIAGNOSIS — M9902 Segmental and somatic dysfunction of thoracic region: Secondary | ICD-10-CM | POA: Diagnosis not present

## 2018-04-03 DIAGNOSIS — M9908 Segmental and somatic dysfunction of rib cage: Secondary | ICD-10-CM | POA: Diagnosis not present

## 2018-04-03 DIAGNOSIS — M9901 Segmental and somatic dysfunction of cervical region: Secondary | ICD-10-CM | POA: Diagnosis not present

## 2018-04-06 DIAGNOSIS — R079 Chest pain, unspecified: Secondary | ICD-10-CM | POA: Diagnosis not present

## 2018-04-06 DIAGNOSIS — R221 Localized swelling, mass and lump, neck: Secondary | ICD-10-CM | POA: Diagnosis not present

## 2018-04-07 DIAGNOSIS — S83512D Sprain of anterior cruciate ligament of left knee, subsequent encounter: Secondary | ICD-10-CM | POA: Diagnosis not present

## 2018-04-07 DIAGNOSIS — M25562 Pain in left knee: Secondary | ICD-10-CM | POA: Diagnosis not present

## 2018-04-07 DIAGNOSIS — M25662 Stiffness of left knee, not elsewhere classified: Secondary | ICD-10-CM | POA: Diagnosis not present

## 2018-04-08 ENCOUNTER — Other Ambulatory Visit: Payer: Self-pay | Admitting: General Surgery

## 2018-04-08 DIAGNOSIS — R221 Localized swelling, mass and lump, neck: Secondary | ICD-10-CM

## 2018-04-08 DIAGNOSIS — R079 Chest pain, unspecified: Secondary | ICD-10-CM

## 2018-04-10 DIAGNOSIS — M546 Pain in thoracic spine: Secondary | ICD-10-CM | POA: Diagnosis not present

## 2018-04-10 DIAGNOSIS — M9901 Segmental and somatic dysfunction of cervical region: Secondary | ICD-10-CM | POA: Diagnosis not present

## 2018-04-10 DIAGNOSIS — M9902 Segmental and somatic dysfunction of thoracic region: Secondary | ICD-10-CM | POA: Diagnosis not present

## 2018-04-10 DIAGNOSIS — M9908 Segmental and somatic dysfunction of rib cage: Secondary | ICD-10-CM | POA: Diagnosis not present

## 2018-04-11 DIAGNOSIS — S83512D Sprain of anterior cruciate ligament of left knee, subsequent encounter: Secondary | ICD-10-CM | POA: Diagnosis not present

## 2018-04-15 ENCOUNTER — Ambulatory Visit
Admission: RE | Admit: 2018-04-15 | Discharge: 2018-04-15 | Disposition: A | Discharge status: Home / Self Care | Payer: BLUE CROSS/BLUE SHIELD | Source: Ambulatory Visit | Attending: General Surgery | Admitting: General Surgery

## 2018-04-15 DIAGNOSIS — R221 Localized swelling, mass and lump, neck: Secondary | ICD-10-CM | POA: Diagnosis not present

## 2018-04-15 DIAGNOSIS — R634 Abnormal weight loss: Secondary | ICD-10-CM | POA: Diagnosis not present

## 2018-04-15 DIAGNOSIS — R079 Chest pain, unspecified: Secondary | ICD-10-CM

## 2018-04-15 MED ORDER — IOPAMIDOL (ISOVUE-300) INJECTION 61%
125.0000 mL | Freq: Once | INTRAVENOUS | Status: AC | PRN
  Administered 2018-04-15: 125 mL via INTRAVENOUS

## 2018-04-16 DIAGNOSIS — Z9889 Other specified postprocedural states: Secondary | ICD-10-CM | POA: Diagnosis not present

## 2018-04-20 ENCOUNTER — Emergency Department (HOSPITAL_COMMUNITY): Payer: BLUE CROSS/BLUE SHIELD

## 2018-04-20 ENCOUNTER — Emergency Department (HOSPITAL_COMMUNITY)
Admission: EM | Admit: 2018-04-20 | Discharge: 2018-04-20 | Disposition: A | Discharge status: Home / Self Care | Payer: BLUE CROSS/BLUE SHIELD | Attending: Emergency Medicine | Admitting: Emergency Medicine

## 2018-04-20 ENCOUNTER — Other Ambulatory Visit: Payer: Self-pay

## 2018-04-20 ENCOUNTER — Encounter (HOSPITAL_COMMUNITY): Payer: Self-pay | Admitting: Neurology

## 2018-04-20 DIAGNOSIS — R531 Weakness: Secondary | ICD-10-CM

## 2018-04-20 DIAGNOSIS — R0902 Hypoxemia: Secondary | ICD-10-CM | POA: Diagnosis not present

## 2018-04-20 DIAGNOSIS — Z7982 Long term (current) use of aspirin: Secondary | ICD-10-CM | POA: Diagnosis not present

## 2018-04-20 DIAGNOSIS — R29810 Facial weakness: Secondary | ICD-10-CM | POA: Diagnosis not present

## 2018-04-20 DIAGNOSIS — R4781 Slurred speech: Secondary | ICD-10-CM | POA: Diagnosis not present

## 2018-04-20 DIAGNOSIS — R202 Paresthesia of skin: Secondary | ICD-10-CM | POA: Diagnosis not present

## 2018-04-20 DIAGNOSIS — I10 Essential (primary) hypertension: Secondary | ICD-10-CM | POA: Diagnosis not present

## 2018-04-20 DIAGNOSIS — R4702 Dysphasia: Secondary | ICD-10-CM | POA: Diagnosis not present

## 2018-04-20 DIAGNOSIS — R29818 Other symptoms and signs involving the nervous system: Secondary | ICD-10-CM | POA: Diagnosis not present

## 2018-04-20 HISTORY — DX: Essential (primary) hypertension: I10

## 2018-04-20 LAB — COMPREHENSIVE METABOLIC PANEL
ALBUMIN: 4.3 g/dL (ref 3.5–5.0)
ALK PHOS: 85 U/L (ref 38–126)
ALT: 47 U/L — ABNORMAL HIGH (ref 0–44)
AST: 68 U/L — ABNORMAL HIGH (ref 15–41)
Anion gap: 8 (ref 5–15)
BILIRUBIN TOTAL: 0.8 mg/dL (ref 0.3–1.2)
BUN: 9 mg/dL (ref 6–20)
CO2: 27 mmol/L (ref 22–32)
Calcium: 9.4 mg/dL (ref 8.9–10.3)
Chloride: 106 mmol/L (ref 98–111)
Creatinine, Ser: 0.97 mg/dL (ref 0.61–1.24)
GFR calc Af Amer: >60 mL/min (ref >60)
GFR calc non Af Amer: >60 mL/min (ref >60)
Glucose, Bld: 112 mg/dL — ABNORMAL HIGH (ref 70–99)
POTASSIUM: 3.9 mmol/L (ref 3.5–5.1)
Sodium: 141 mmol/L (ref 135–145)
TOTAL PROTEIN: 7.5 g/dL (ref 6.5–8.1)

## 2018-04-20 LAB — I-STAT CHEM 8, ED
BUN: 9 mg/dL (ref 6–20)
Calcium, Ion: 1.13 mmol/L — ABNORMAL LOW (ref 1.15–1.40)
Chloride: 105 mmol/L (ref 98–111)
Creatinine, Ser: 0.9 mg/dL (ref 0.61–1.24)
Glucose, Bld: 110 mg/dL — ABNORMAL HIGH (ref 70–99)
HEMATOCRIT: 46 % (ref 39.0–52.0)
Hemoglobin: 15.6 g/dL (ref 13.0–17.0)
Potassium: 3.9 mmol/L (ref 3.5–5.1)
Sodium: 139 mmol/L (ref 135–145)
TCO2: 23 mmol/L (ref 22–32)

## 2018-04-20 LAB — CBC
HEMATOCRIT: 46.7 % (ref 39.0–52.0)
Hemoglobin: 15.3 g/dL (ref 13.0–17.0)
MCH: 29.5 pg (ref 26.0–34.0)
MCHC: 32.8 g/dL (ref 30.0–36.0)
MCV: 90.2 fL (ref 78.0–100.0)
Platelets: 263 10*3/uL (ref 150–400)
RBC: 5.18 MIL/uL (ref 4.22–5.81)
RDW: 13.2 % (ref 11.5–15.5)
WBC: 7.4 10*3/uL (ref 4.0–10.5)

## 2018-04-20 LAB — DIFFERENTIAL
ABS IMMATURE GRANULOCYTES: 0 10*3/uL (ref 0.0–0.1)
Basophils Absolute: 0 10*3/uL (ref 0.0–0.1)
Basophils Relative: 1 %
EOS ABS: 0.1 10*3/uL (ref 0.0–0.7)
Eosinophils Relative: 1 %
IMMATURE GRANULOCYTES: 0 %
LYMPHS ABS: 1.1 10*3/uL (ref 0.7–4.0)
Lymphocytes Relative: 15 %
Monocytes Absolute: 0.6 10*3/uL (ref 0.1–1.0)
Monocytes Relative: 7 %
NEUTROS ABS: 5.6 10*3/uL (ref 1.7–7.7)
Neutrophils Relative %: 76 %

## 2018-04-20 LAB — APTT: aPTT: 27 seconds (ref 24–36)

## 2018-04-20 LAB — PROTIME-INR
INR: 1.02
Prothrombin Time: 13.3 seconds (ref 11.4–15.2)

## 2018-04-20 LAB — I-STAT TROPONIN, ED: Troponin i, poc: 0.03 ng/mL (ref 0.00–0.08)

## 2018-04-20 MED ORDER — ASPIRIN 81 MG PO TABS: 81.0000 mg | ORAL_TABLET | Freq: Every day | ORAL | 0 refills | Status: AC

## 2018-04-20 MED ORDER — IOPAMIDOL (ISOVUE-370) INJECTION 76%
50.0000 mL | Freq: Once | INTRAVENOUS | Status: AC | PRN
  Administered 2018-04-20: 50 mL via INTRAVENOUS

## 2018-04-20 MED ORDER — LORAZEPAM 2 MG/ML IJ SOLN
1.0000 mg | Freq: Once | INTRAMUSCULAR | Status: AC
  Administered 2018-04-20: 1 mg via INTRAVENOUS
  Filled 2018-04-20: qty 1

## 2018-04-20 NOTE — ED Notes (Signed)
Ativan 1 mg wasted with Jeralene Huffasy Hamann, RN wasted in [place].

## 2018-04-20 NOTE — ED Notes (Signed)
Blood tube to lab

## 2018-04-20 NOTE — ED Notes (Signed)
Pt returns from [facility] Neuro [facility].

## 2018-04-20 NOTE — Discharge Instructions (Addendum)
You may or may not of had a mini stroke today.  If you develop recurrent symptoms, call 911 or return to the ER.  Follow-up with neurology.  If you develop a headache, vomiting, shortness of breath, chest pain, or any new weakness/numbness or blurry vision then come back to the ER.  Otherwise start taking a baby aspirin per day

## 2018-04-20 NOTE — ED Triage Notes (Signed)
Pt arrived via [facility], pt driving to work felt light headed looking in mirror and reports he saw significant right sided facial droop and reports aphasia.  RLE c/o soreness, RUE c/o being "tingly" Pt states he had a CT last Wednesday for a lump on the left side of his neck but doesn't know the results.

## 2018-04-20 NOTE — ED Notes (Signed)
Dr. Criss AlvineGoldston aware that urine not collected.

## 2018-04-20 NOTE — Consult Note (Addendum)
Neurology Consultation  Reason for Consult: Code stroke  referring Physician: Emergency department  CC: Right-sided tingling/weird sensation along with right facial droop  History is obtained from: Patient  HPI: Alexander Michael is a 32 y.o. male with no significant past medical history.  Patient states that at approximately 1045 this morning he was driving and he noticed sudden onset of lightheadedness/blurry headedness.  This was followed by a sensation of tingling/odd feeling in his right arm and leg.  He looked in the rearview mirror and felt as though he had a right facial droop.  For that reason patient was brought to the emergency room via ambulance as a code stroke.  After entering the emergency room he states that his symptoms have begun to resolve.  He still has some right sided odd sensation but he feels his voice has resolved.   Patient denies any chronic headaches, scotoma, wavy lines,*bursts nor kaleidoscope-like vision.  He also denies no headache during this event  LKW: 1045 on 04/20/2018 tpa given?: no,  Premorbid modified Rankin scale (mRS): o NIH stroke scale   ROS: A 14 point ROS was performed and is negative except as noted in the HPI.   Past Medical History:  Diagnosis Date  . 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 Hypertension      Family History  Problem Relation Age of Onset  . Breast cancer Mother   . Lung cancer Maternal Grandfather   . Diabetes Maternal Grandfather   . Heart disease Maternal Grandfather   . COPD Paternal Grandmother   . Diabetes Paternal Grandfather   . Colon cancer Neg Hx   . Esophageal cancer Neg Hx   . Stomach cancer Neg Hx   . Pancreatic cancer Neg Hx      Social History:   reports that he has never smoked. He has never used smokeless tobacco. He reports that he drinks alcohol. He reports that he does not use drugs.  Medications No current facility-administered medications for this  encounter.   Current Outpatient Medications:  .  amLODipine (NORVASC) 5 MG tablet, Take 5 mg by mouth daily., Disp: , Rfl: 0 .  ciprofloxacin (CIPRO) 500 MG tablet, Take 1 tablet (500 mg total) by mouth 2 (two) times daily. (Patient not taking: Reported on 04/20/2018), Disp: 20 tablet, Rfl: 0   Exam: Current vital signs: BP (!) 138/104   Pulse 71   Resp 19   Ht 6\' 3"  (1.905 m)   Wt 81.6 kg   SpO2 99%   BMI 22.50 kg/m  Vital signs in last 24 hours: Pulse Rate:  [61-75] 71 (08/26 1330) Resp:  [11-21] 19 (08/26 1330) BP: (138-162)/(76-104) 138/104 (08/26 1330) SpO2:  [96 %-99 %] 99 % (08/26 1330) Weight:  [81.6 kg] 81.6 kg (08/26 1230)  GENERAL: Awake, alert in NAD HEENT: - Normocephalic and atraumatic,  LUNGS - Clear to auscultation bilaterally with no wheezes CV - S1S2 RRR,  ABDOMEN - Soft, nontender, nondistended with normoactive BS Ext: warm, well perfused, intact peripheral pulses  NEURO:  Mental Status: AA&Ox3, speech is at baseline patient does have a lisp however he was scored as a 1 on the NIH stroke scale for possible dysarthria.  Naming, repetition, fluency, and comprehension intact. Cranial Nerves: PERRL mm/brisk. EOMI, visual fields full, no facial asymmetry,  facial sensation intact, hearing intact, tongue/uvula/soft palate    Motor: Five 4/5 throughout Tone: is normal and bulk is normal Sensation-decreased  along the right arm and left leg subjectively Coordination: FTN intact bilaterally, no ataxia in BLE. Gait- deferred  Labs I have reviewed labs in epic and the results pertinent to this consultation are:   CBC    Component Value Date/Time   WBC 7.4 04/20/2018 1137   RBC 5.18 04/20/2018 1137   HGB 15.6 04/20/2018 1146   HCT 46.0 04/20/2018 1146   PLT 263 04/20/2018 1137   MCV 90.2 04/20/2018 1137   MCH 29.5 04/20/2018 1137   MCHC 32.8 04/20/2018 1137   RDW 13.2 04/20/2018 1137   LYMPHSABS 1.1 04/20/2018 1137   MONOABS 0.6 04/20/2018 1137   EOSABS  0.1 04/20/2018 1137   BASOSABS 0.0 04/20/2018 1137    CMP     Component Value Date/Time   NA 139 04/20/2018 1146   K 3.9 04/20/2018 1146   CL 105 04/20/2018 1146   CO2 27 04/20/2018 1137   GLUCOSE 110 (H) 04/20/2018 1146   BUN 9 04/20/2018 1146   CREATININE 0.90 04/20/2018 1146   CALCIUM 9.4 04/20/2018 1137   PROT 7.5 04/20/2018 1137   ALBUMIN 4.3 04/20/2018 1137   AST 68 (H) 04/20/2018 1137   ALT 47 (H) 04/20/2018 1137   ALKPHOS 85 04/20/2018 1137   BILITOT 0.8 04/20/2018 1137   GFRNONAA >60 04/20/2018 1137   GFRAA >60 04/20/2018 1137    Lipid Panel  No results found for: CHOL, TRIG, HDL, CHOLHDL, VLDL, LDLCALC, LDLDIRECT   Imaging I have reviewed the images obtained:  CT-scan of the brain--CT of brain was normal with an aspect score of 10  CTA of head and neck shows-- 1. No emergent large vessel occlusion. 2. Widely patent cervical carotid and vertebral arteries allowing for beam hardening artifact proximally. 3. Limited assessment of the small to medium sized intracranial arteries.  MRI brain MRI brain shows no acute infarct on DWI.  Assessment: 32 year old male with sudden onset of odd sensation on the right side of his face, right facial droop and decreased sensation on the right arm and leg which have significantly improved.  MRI brain shows no acute infarct on DWI.  Seeing that the majority of the diagnostic tests are negative most likely complicate a migraine versus TIA   Recommendations: Possible migraine HA versus TIA ABCD score 2 Follow up with out patient neurology ASA 81 mg daily   NEUROHOSPITALIST ADDENDUM   I performed face-to-face diagnostic evaluation of this patient, Patient presented as a code stroke, had mild sensory symptoms slurred speech.  Symptoms had resolved.  Possible complicated migraine versus TIA with low ABCD 2 score.  Patient does have history of hypertension on on amlodipine.  Recommend aspirin daily and follow-up with  outpatient neurology.  I have reviewed the contents of history and physical exam as documented by PA/ARNP/Resident and agree with above documentation.  I have discussed and formulated the above plan as documented. Edits to the note have been made as needed.    Georgiana Spinner Aroor MD [facility] [phone]   If 7pm to 7am, please call on call as listed on AMION.

## 2018-04-20 NOTE — ED Notes (Addendum)
Called to MRI for further medication for claustraphobia.  Pt alert and oriented and stats he feels no response to earlier Ativan.
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
## 2018-04-21 DIAGNOSIS — J188 Other pneumonia, unspecified organism: Secondary | ICD-10-CM | POA: Diagnosis not present

## 2018-04-21 DIAGNOSIS — R221 Localized swelling, mass and lump, neck: Secondary | ICD-10-CM | POA: Diagnosis not present

## 2018-04-21 DIAGNOSIS — G43009 Migraine without aura, not intractable, without status migrainosus: Secondary | ICD-10-CM | POA: Diagnosis not present

## 2018-04-21 DIAGNOSIS — D179 Benign lipomatous neoplasm, unspecified: Secondary | ICD-10-CM | POA: Diagnosis not present

## 2018-04-21 DIAGNOSIS — Z6837 Body mass index (BMI) 37.0-37.9, adult: Secondary | ICD-10-CM | POA: Diagnosis not present

## 2018-04-22 ENCOUNTER — Encounter: Payer: Self-pay | Admitting: Neurology

## 2018-04-22 ENCOUNTER — Ambulatory Visit (INDEPENDENT_AMBULATORY_CARE_PROVIDER_SITE_OTHER): Payer: BLUE CROSS/BLUE SHIELD | Admitting: Neurology

## 2018-04-22 ENCOUNTER — Other Ambulatory Visit: Payer: Self-pay

## 2018-04-22 VITALS — BP 149/88 | HR 67 | Resp 16 | Ht 75.0 in | Wt 280.0 lb

## 2018-04-22 DIAGNOSIS — R531 Weakness: Secondary | ICD-10-CM | POA: Diagnosis not present

## 2018-04-22 DIAGNOSIS — R200 Anesthesia of skin: Secondary | ICD-10-CM

## 2018-04-22 DIAGNOSIS — G43109 Migraine with aura, not intractable, without status migrainosus: Secondary | ICD-10-CM | POA: Diagnosis not present

## 2018-04-22 DIAGNOSIS — I10 Essential (primary) hypertension: Secondary | ICD-10-CM

## 2018-04-22 DIAGNOSIS — R2990 Unspecified symptoms and signs involving the nervous system: Secondary | ICD-10-CM | POA: Diagnosis not present
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
## 2018-04-28 ENCOUNTER — Telehealth: Payer: Self-pay | Admitting: *Deleted

## 2018-04-28 ENCOUNTER — Other Ambulatory Visit: Payer: Self-pay

## 2018-04-28 ENCOUNTER — Ambulatory Visit (HOSPITAL_COMMUNITY): Payer: BLUE CROSS/BLUE SHIELD | Attending: Cardiovascular Disease

## 2018-04-28 DIAGNOSIS — R200 Anesthesia of skin: Secondary | ICD-10-CM

## 2018-04-28 DIAGNOSIS — I10 Essential (primary) hypertension: Secondary | ICD-10-CM | POA: Diagnosis not present

## 2018-04-28 DIAGNOSIS — R931 Abnormal findings on diagnostic imaging of heart and coronary circulation: Secondary | ICD-10-CM

## 2018-04-28 DIAGNOSIS — R531 Weakness: Secondary | ICD-10-CM

## 2018-04-28 DIAGNOSIS — R2990 Unspecified symptoms and signs involving the nervous system: Secondary | ICD-10-CM

## 2018-04-28 DIAGNOSIS — G43109 Migraine with aura, not intractable, without status migrainosus: Secondary | ICD-10-CM

## 2018-04-28 NOTE — Telephone Encounter (Signed)
Spoke with pt. and reviewed results of Echo. She verbalized understanding of same.  He is agreeable with cardiology referral.  Order for referral placed in Epic/fim

## 2018-04-28 NOTE — Telephone Encounter (Signed)
-----   Message from Asa Lente, MD sent at 04/28/2018  5:13 PM EDT ----- The echocardiogram did not show any shunting from the right side of the heart to the left side of the heart.  The episode he had was most likely a complicated migraine.  The mitral valve was thickened though there was no regurgitation.   Thickening does not seem to be causing any issues now but can be a risk factor for mitral valve regurgitation in the future.  I would like him to see cardiology because he will likely need to have a follow-up echocardiogram at some point and it would be best if he is followed by them.

## 2018-05-22 DIAGNOSIS — J188 Other pneumonia, unspecified organism: Secondary | ICD-10-CM | POA: Diagnosis not present

## 2018-05-22 DIAGNOSIS — Z23 Encounter for immunization: Secondary | ICD-10-CM | POA: Diagnosis not present

## 2018-05-22 DIAGNOSIS — Z6837 Body mass index (BMI) 37.0-37.9, adult: Secondary | ICD-10-CM | POA: Diagnosis not present

## 2018-06-02 DIAGNOSIS — Z1329 Encounter for screening for other suspected endocrine disorder: Secondary | ICD-10-CM | POA: Diagnosis not present

## 2018-06-02 DIAGNOSIS — Z0000 Encounter for general adult medical examination without abnormal findings: Secondary | ICD-10-CM | POA: Diagnosis not present

## 2018-06-02 DIAGNOSIS — Z13220 Encounter for screening for lipoid disorders: Secondary | ICD-10-CM | POA: Diagnosis not present

## 2018-06-02 DIAGNOSIS — Z114 Encounter for screening for human immunodeficiency virus [HIV]: Secondary | ICD-10-CM | POA: Diagnosis not present

## 2018-06-04 DIAGNOSIS — Z0000 Encounter for general adult medical examination without abnormal findings: Secondary | ICD-10-CM | POA: Diagnosis not present

## 2018-06-16 ENCOUNTER — Ambulatory Visit: Payer: BLUE CROSS/BLUE SHIELD | Admitting: Cardiovascular Disease

## 2018-07-10 DIAGNOSIS — H6012 Cellulitis of left external ear: Secondary | ICD-10-CM | POA: Diagnosis not present

## 2018-07-17 DIAGNOSIS — H6012 Cellulitis of left external ear: Secondary | ICD-10-CM | POA: Diagnosis not present

## 2018-07-17 DIAGNOSIS — H6592 Unspecified nonsuppurative otitis media, left ear: Secondary | ICD-10-CM | POA: Diagnosis not present

## 2018-07-17 DIAGNOSIS — Z6837 Body mass index (BMI) 37.0-37.9, adult: Secondary | ICD-10-CM | POA: Diagnosis not present

## 2018-09-17 DIAGNOSIS — Z6838 Body mass index (BMI) 38.0-38.9, adult: Secondary | ICD-10-CM | POA: Diagnosis not present

## 2018-09-17 DIAGNOSIS — J069 Acute upper respiratory infection, unspecified: Secondary | ICD-10-CM | POA: Diagnosis not present

## 2018-12-03 DIAGNOSIS — Z719 Counseling, unspecified: Secondary | ICD-10-CM | POA: Diagnosis not present

## 2018-12-03 DIAGNOSIS — I10 Essential (primary) hypertension: Secondary | ICD-10-CM | POA: Diagnosis not present

## 2018-12-03 DIAGNOSIS — E782 Mixed hyperlipidemia: Secondary | ICD-10-CM | POA: Diagnosis not present

## 2018-12-03 DIAGNOSIS — J309 Allergic rhinitis, unspecified: Secondary | ICD-10-CM | POA: Diagnosis not present

## 2018-12-24 DIAGNOSIS — I10 Essential (primary) hypertension: Secondary | ICD-10-CM | POA: Diagnosis not present

## 2018-12-24 DIAGNOSIS — G4700 Insomnia, unspecified: Secondary | ICD-10-CM | POA: Diagnosis not present

## 2018-12-24 DIAGNOSIS — J309 Allergic rhinitis, unspecified: Secondary | ICD-10-CM | POA: Diagnosis not present

## 2018-12-24 DIAGNOSIS — G43909 Migraine, unspecified, not intractable, without status migrainosus: Secondary | ICD-10-CM | POA: Diagnosis not present

## 2019-01-14 DIAGNOSIS — F411 Generalized anxiety disorder: Secondary | ICD-10-CM | POA: Diagnosis not present

## 2019-01-14 DIAGNOSIS — I10 Essential (primary) hypertension: Secondary | ICD-10-CM | POA: Diagnosis not present

## 2019-01-14 DIAGNOSIS — G4700 Insomnia, unspecified: Secondary | ICD-10-CM | POA: Diagnosis not present

## 2019-01-14 DIAGNOSIS — G43009 Migraine without aura, not intractable, without status migrainosus: Secondary | ICD-10-CM | POA: Diagnosis not present

## 2019-02-26 DIAGNOSIS — Z20828 Contact with and (suspected) exposure to other viral communicable diseases: Secondary | ICD-10-CM | POA: Diagnosis not present

## 2019-03-01 DIAGNOSIS — F411 Generalized anxiety disorder: Secondary | ICD-10-CM | POA: Diagnosis not present

## 2019-03-01 DIAGNOSIS — G43909 Migraine, unspecified, not intractable, without status migrainosus: Secondary | ICD-10-CM | POA: Diagnosis not present

## 2019-03-01 DIAGNOSIS — I10 Essential (primary) hypertension: Secondary | ICD-10-CM | POA: Diagnosis not present

## 2019-03-01 DIAGNOSIS — G4700 Insomnia, unspecified: Secondary | ICD-10-CM | POA: Diagnosis not present

## 2019-04-06 DIAGNOSIS — F411 Generalized anxiety disorder: Secondary | ICD-10-CM | POA: Diagnosis not present

## 2019-04-06 DIAGNOSIS — G43909 Migraine, unspecified, not intractable, without status migrainosus: Secondary | ICD-10-CM | POA: Diagnosis not present

## 2019-04-06 DIAGNOSIS — I10 Essential (primary) hypertension: Secondary | ICD-10-CM | POA: Diagnosis not present

## 2019-04-06 DIAGNOSIS — G4700 Insomnia, unspecified: Secondary | ICD-10-CM | POA: Diagnosis not present

## 2019-06-09 ENCOUNTER — Other Ambulatory Visit: Payer: Self-pay

## 2019-06-09 DIAGNOSIS — Z20828 Contact with and (suspected) exposure to other viral communicable diseases: Secondary | ICD-10-CM | POA: Diagnosis not present

## 2019-06-09 DIAGNOSIS — Z20822 Contact with and (suspected) exposure to covid-19: Secondary | ICD-10-CM

## 2019-06-11 LAB — NOVEL CORONAVIRUS, NAA: SARS-CoV-2, NAA: NOT DETECTED

## 2019-06-14 DIAGNOSIS — Z0000 Encounter for general adult medical examination without abnormal findings: Secondary | ICD-10-CM | POA: Diagnosis not present

## 2019-06-15 DIAGNOSIS — E782 Mixed hyperlipidemia: Secondary | ICD-10-CM | POA: Diagnosis not present

## 2019-06-15 DIAGNOSIS — Z0000 Encounter for general adult medical examination without abnormal findings: Secondary | ICD-10-CM | POA: Diagnosis not present

## 2019-06-15 DIAGNOSIS — Z23 Encounter for immunization: Secondary | ICD-10-CM | POA: Diagnosis not present

## 2019-06-21 DIAGNOSIS — G43909 Migraine, unspecified, not intractable, without status migrainosus: Secondary | ICD-10-CM | POA: Diagnosis not present

## 2019-06-21 DIAGNOSIS — I10 Essential (primary) hypertension: Secondary | ICD-10-CM | POA: Diagnosis not present

## 2019-06-21 DIAGNOSIS — F411 Generalized anxiety disorder: Secondary | ICD-10-CM | POA: Diagnosis not present

## 2019-06-21 DIAGNOSIS — G4700 Insomnia, unspecified: Secondary | ICD-10-CM | POA: Diagnosis not present

## 2019-07-26 ENCOUNTER — Emergency Department (HOSPITAL_COMMUNITY): Payer: No Typology Code available for payment source

## 2019-07-26 ENCOUNTER — Emergency Department (HOSPITAL_COMMUNITY)
Admission: EM | Admit: 2019-07-26 | Discharge: 2019-07-26 | Disposition: A | Discharge status: Home / Self Care | Payer: No Typology Code available for payment source | Attending: Emergency Medicine | Admitting: Emergency Medicine

## 2019-07-26 ENCOUNTER — Encounter (HOSPITAL_COMMUNITY): Payer: Self-pay | Admitting: Emergency Medicine

## 2019-07-26 ENCOUNTER — Other Ambulatory Visit: Payer: Self-pay

## 2019-07-26 DIAGNOSIS — Z79899 Other long term (current) drug therapy: Secondary | ICD-10-CM | POA: Insufficient documentation

## 2019-07-26 DIAGNOSIS — R002 Palpitations: Secondary | ICD-10-CM | POA: Diagnosis not present

## 2019-07-26 DIAGNOSIS — R42 Dizziness and giddiness: Secondary | ICD-10-CM | POA: Diagnosis not present

## 2019-07-26 DIAGNOSIS — H538 Other visual disturbances: Secondary | ICD-10-CM | POA: Diagnosis not present

## 2019-07-26 DIAGNOSIS — I10 Essential (primary) hypertension: Secondary | ICD-10-CM | POA: Insufficient documentation

## 2019-07-26 DIAGNOSIS — Z7982 Long term (current) use of aspirin: Secondary | ICD-10-CM | POA: Insufficient documentation

## 2019-07-26 DIAGNOSIS — R232 Flushing: Secondary | ICD-10-CM | POA: Diagnosis not present

## 2019-07-26 LAB — BASIC METABOLIC PANEL
Anion gap: 10 (ref 5–15)
BUN: 11 mg/dL (ref 6–20)
CO2: 26 mmol/L (ref 22–32)
Calcium: 9.5 mg/dL (ref 8.9–10.3)
Chloride: 103 mmol/L (ref 98–111)
Creatinine, Ser: 0.98 mg/dL (ref 0.61–1.24)
GFR calc Af Amer: >60 mL/min (ref >60)
GFR calc non Af Amer: >60 mL/min (ref >60)
Glucose, Bld: 100 mg/dL — ABNORMAL HIGH (ref 70–99)
Potassium: 4.2 mmol/L (ref 3.5–5.1)
Sodium: 139 mmol/L (ref 135–145)

## 2019-07-26 LAB — CBC
HCT: 47.7 % (ref 39.0–52.0)
Hemoglobin: 16 g/dL (ref 13.0–17.0)
MCH: 30.2 pg (ref 26.0–34.0)
MCHC: 33.5 g/dL (ref 30.0–36.0)
MCV: 90 fL (ref 80.0–100.0)
Platelets: 259 10*3/uL (ref 150–400)
RBC: 5.3 MIL/uL (ref 4.22–5.81)
RDW: 12.6 % (ref 11.5–15.5)
WBC: 7.4 10*3/uL (ref 4.0–10.5)
nRBC: 0 % (ref 0.0–0.2)

## 2019-07-26 LAB — TROPONIN I (HIGH SENSITIVITY): Troponin I (High Sensitivity): 7 ng/L (ref <18)

## 2019-07-26 LAB — TSH: TSH: 2.225 u[IU]/mL (ref 0.350–4.500)

## 2019-07-26 MED ORDER — SODIUM CHLORIDE 0.9% FLUSH
3.0000 mL | Freq: Once | INTRAVENOUS | Status: AC
  Administered 2019-07-26: 3 mL via INTRAVENOUS
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
## 2019-07-26 NOTE — ED Triage Notes (Signed)
Pt arrives via POV from home with chest palpitations since Friday. Reports shallow breathing with palpitations, chest tightness and dizziness. Pt awake, alert, approriate, VSS.

## 2019-07-26 NOTE — Discharge Instructions (Addendum)
Follow-up with your doctor or cardiology.  He may need a longer-term monitor to watch for these feelings of your heart racing.

## 2019-07-28 DIAGNOSIS — R002 Palpitations: Secondary | ICD-10-CM | POA: Insufficient documentation

## 2019-07-28 DIAGNOSIS — Z7189 Other specified counseling: Secondary | ICD-10-CM | POA: Insufficient documentation

## 2019-07-28 DIAGNOSIS — I10 Essential (primary) hypertension: Secondary | ICD-10-CM | POA: Diagnosis not present

## 2019-07-28 DIAGNOSIS — G43909 Migraine, unspecified, not intractable, without status migrainosus: Secondary | ICD-10-CM | POA: Diagnosis not present
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
## 2019-07-29 ENCOUNTER — Encounter: Payer: Self-pay | Admitting: Cardiology

## 2019-07-29 ENCOUNTER — Ambulatory Visit (INDEPENDENT_AMBULATORY_CARE_PROVIDER_SITE_OTHER): Payer: BC Managed Care – PPO | Admitting: Cardiology

## 2019-07-29 ENCOUNTER — Other Ambulatory Visit: Payer: Self-pay

## 2019-07-29 VITALS — BP 128/74 | HR 65 | Temp 98.1°F | Ht 75.0 in | Wt 307.4 lb

## 2019-07-29 DIAGNOSIS — I10 Essential (primary) hypertension: Secondary | ICD-10-CM

## 2019-07-29 DIAGNOSIS — R002 Palpitations: Secondary | ICD-10-CM

## 2019-07-29 DIAGNOSIS — Z7189 Other specified counseling: Secondary | ICD-10-CM

## 2019-07-29 NOTE — Patient Instructions (Addendum)
Medication Instructions:  Your physician recommends that you continue on your current medications as directed. Please refer to the Current Medication list given to you today.  *If you need a refill on your cardiac medications before your next appointment, please call your pharmacy*  Lab Work: NONE ordered at this time of appointment   If you have labs (blood work) drawn today and your tests are completely normal, you will receive your results only by: Marland Kitchen MyChart Message (if you have MyChart) OR . A paper copy in the mail If you have any lab test that is abnormal or we need to change your treatment, we will call you to review the results.  Testing/Procedures: NONE ordered at this time of appointment   Follow-Up: At [facility], you and your health needs are our priority.  As part of our continuing mission to provide you with exceptional heart care, we have created designated Provider Care Teams.  These Care Teams include your primary Cardiologist (physician) and Advanced Practice Providers (APPs -  Physician Assistants and Nurse Practitioners) who all work together to provide you with the care you need, when you need it.  Your next appointment:    AS NEEDED  The format for your next appointment:   Either In Person or Virtual  Provider:   You may see Minus Breeding, MD or one of the following Advanced Practice Providers on your designated Care Team:    Rosaria Ferries, PA-C  Jory Sims, DNP, ANP  Cadence Kathlen Mody, NP  Other Instructions

## 2019-08-09 DIAGNOSIS — Z03818 Encounter for observation for suspected exposure to other biological agents ruled out: Secondary | ICD-10-CM | POA: Diagnosis not present

## 2019-08-09 DIAGNOSIS — Z20828 Contact with and (suspected) exposure to other viral communicable diseases: Secondary | ICD-10-CM | POA: Diagnosis not present

## 2019-08-26 IMAGING — CT CT ABD-PELV W/ CM
1 of 2 series · 14 of 32 positions shown, 19 images · IV contrast (APPLIED)
Comparison: 08/21/2014

CLINICAL DATA: Bright red stool x1 week, nausea, evaluate for
diverticulitis

EXAM:
CT ABDOMEN AND PELVIS WITH CONTRAST
TECHNIQUE: Multidetector CT imaging of the abdomen and pelvis was performed
using the standard protocol following bolus administration of
intravenous contrast.
CONTRAST:  125mL UOXU3D-199 IOPAMIDOL (UOXU3D-199) INJECTION 61%

[Series 2: abd/pelvis w/cm · axial · 0.96mm/px · z∈[-542,+13]mm · 14 of 125 slices shown, 19 images]
[im 7/125  soft-tissue]
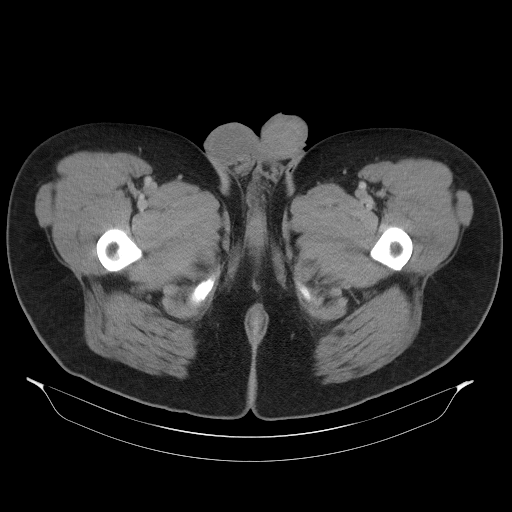
[im 7/125  bone]
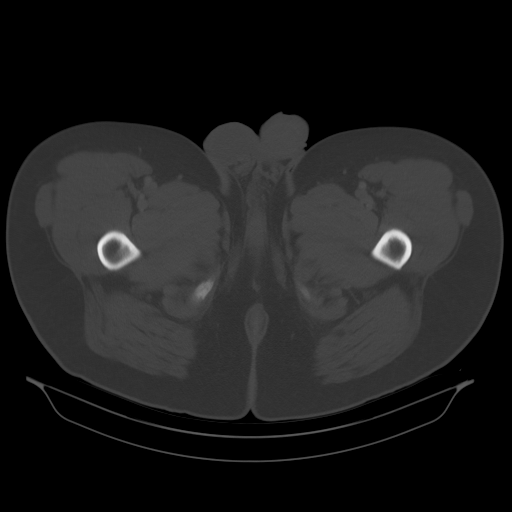
[im 14/125  soft-tissue]
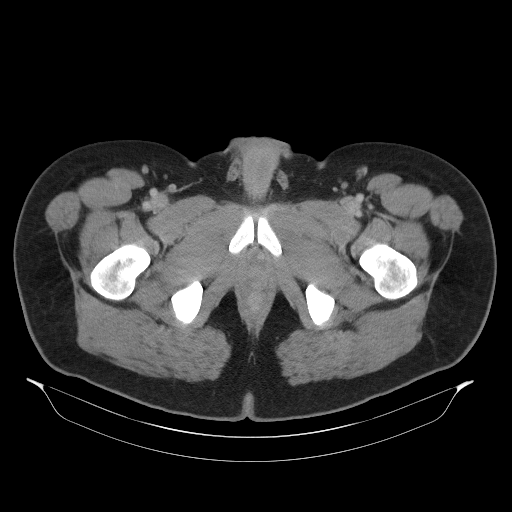
[im 28/125  soft-tissue]
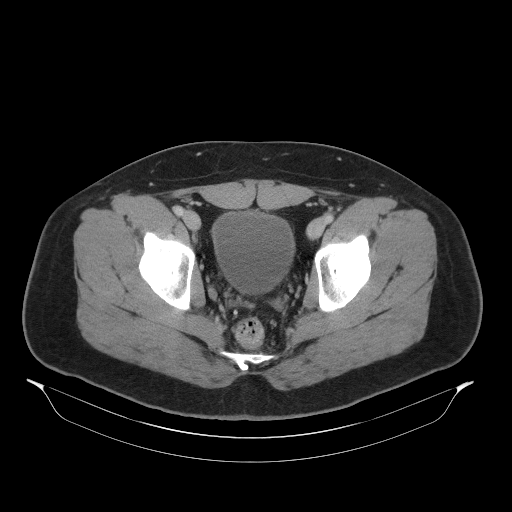
[im 35/125  soft-tissue]
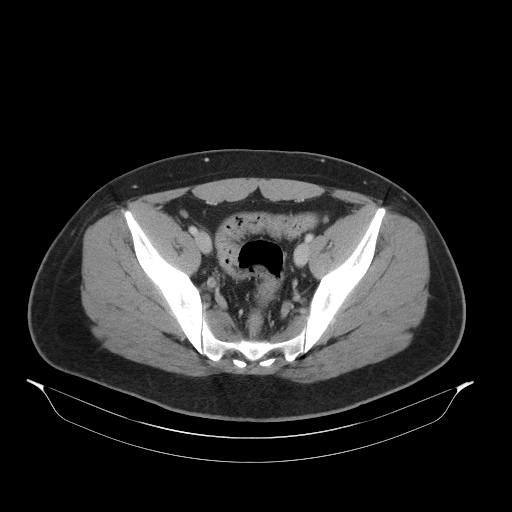
[im 42/125  soft-tissue]
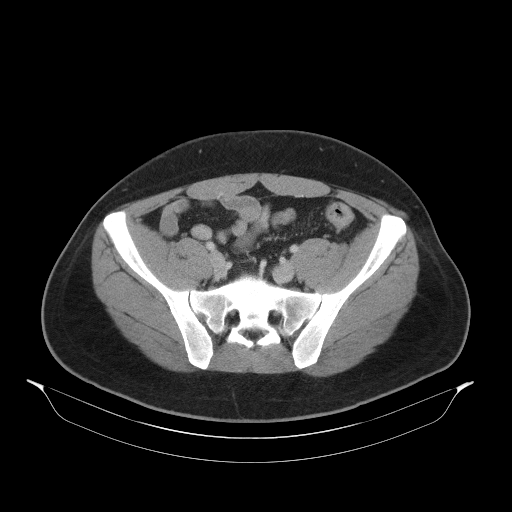
[im 56/125  soft-tissue]
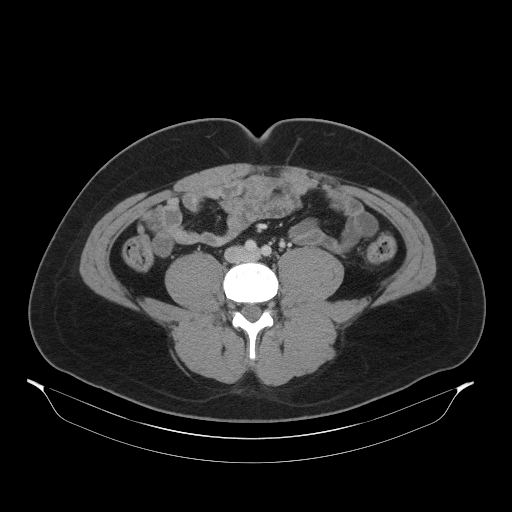
[im 63/125  soft-tissue]
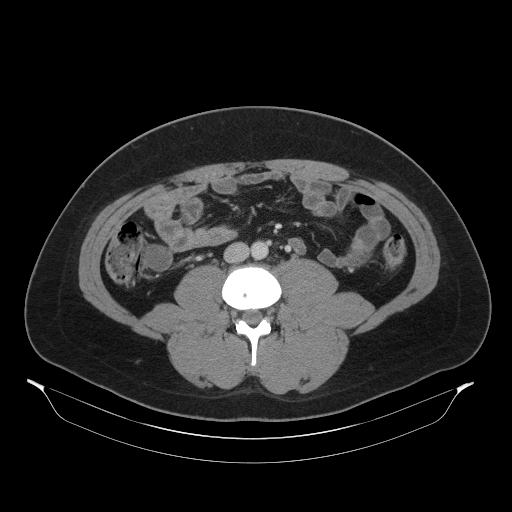
[im 69/125  soft-tissue]
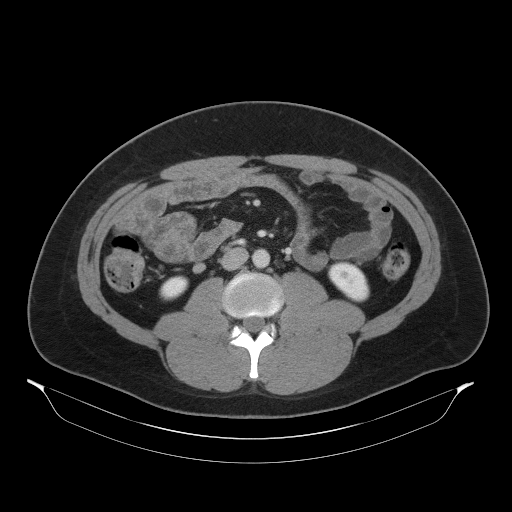
[im 83/125  soft-tissue]
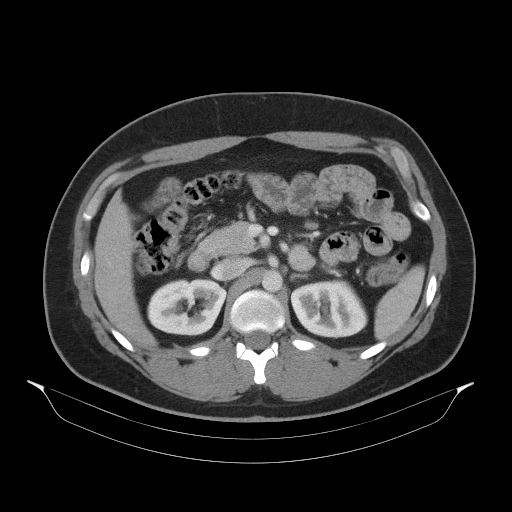
[im 83/125  bone]
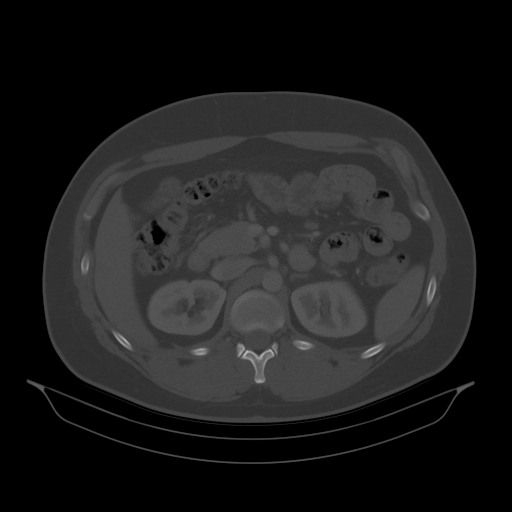
[im 90/125  soft-tissue]
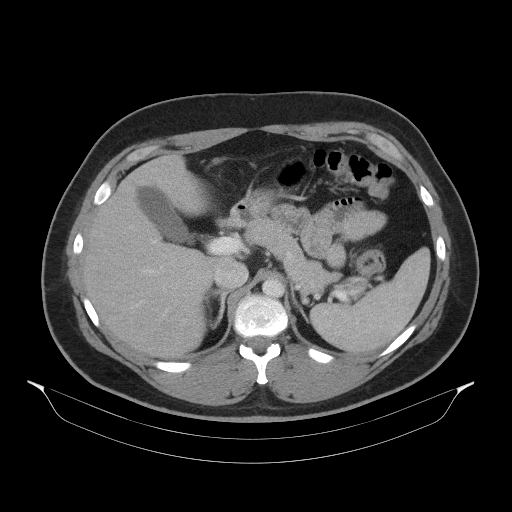
[im 97/125  soft-tissue]
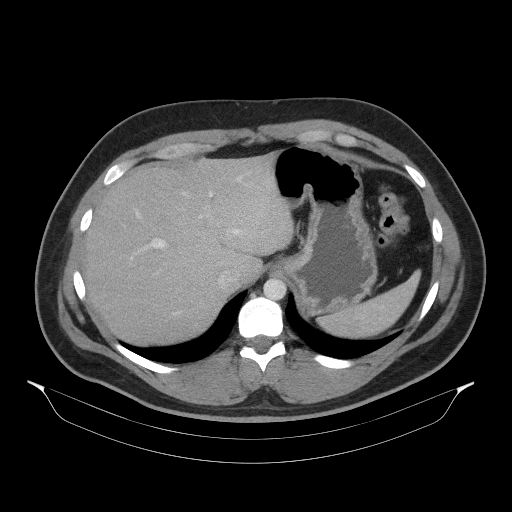
[im 97/125  lung]
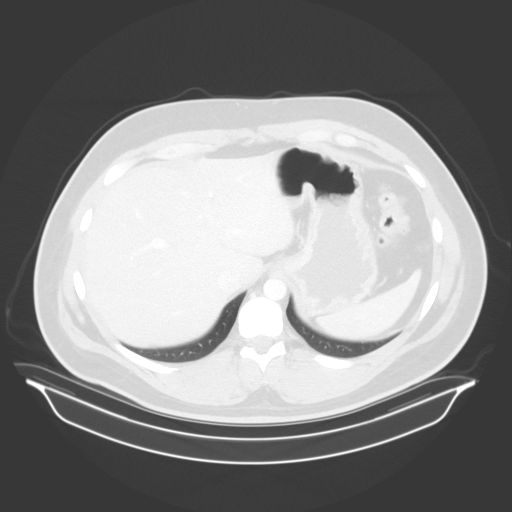
[im 104/125  lung]
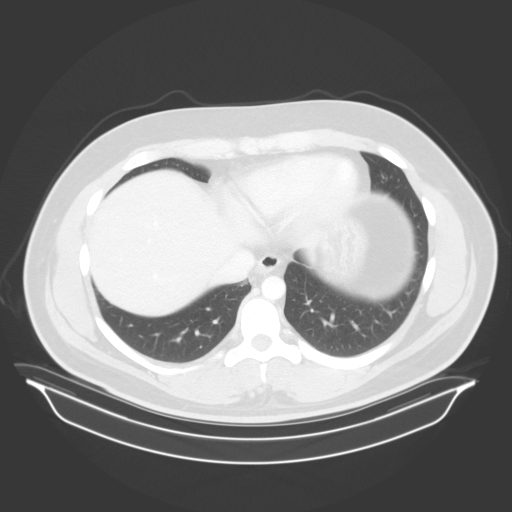
[im 111/125  soft-tissue]
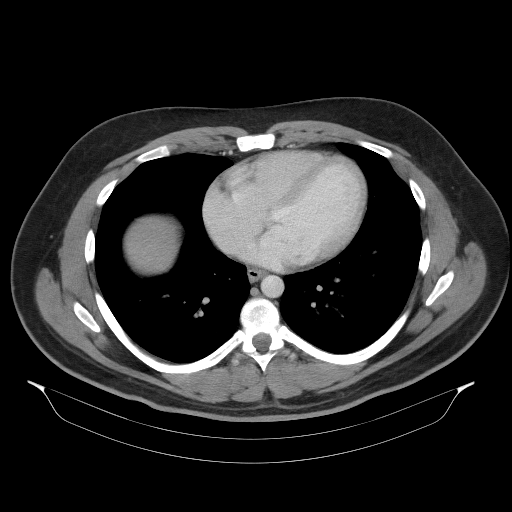
[im 111/125  lung]
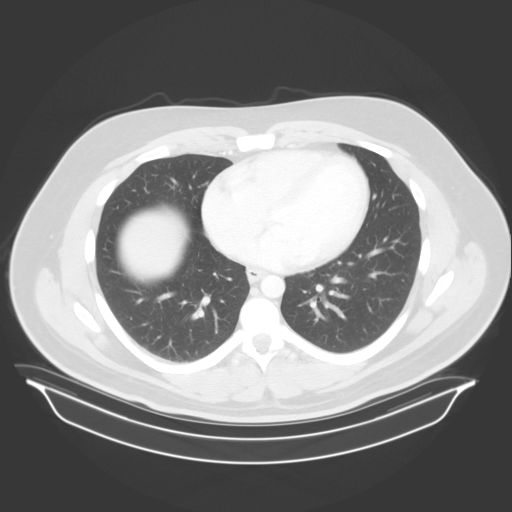
[im 118/125  soft-tissue]
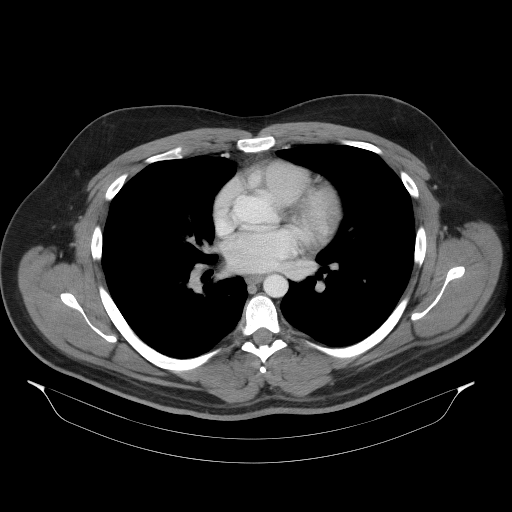
[im 118/125  lung]
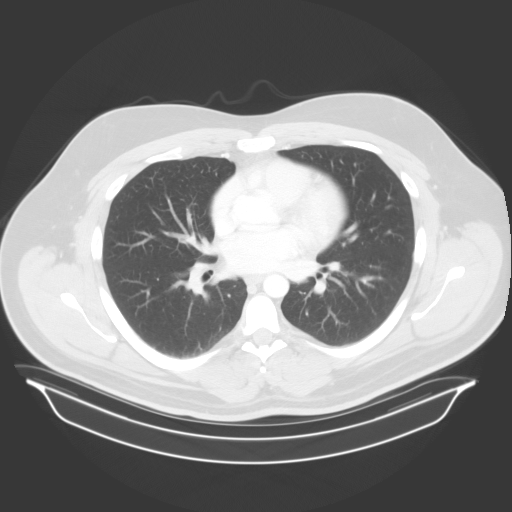

[14 of 32 positions shown; findings below may reference images not displayed]

FINDINGS: Lower chest: Lung bases are clear.

Hepatobiliary: Liver is within normal limits.

Gallbladder is unremarkable. No intrahepatic or extrahepatic ductal
dilatation.

Pancreas: Within normal limits.

Spleen: Within normal limits.

Adrenals/Urinary Tract: Adrenal glands are within normal limits.

Kidneys are within normal limits.  No hydronephrosis.

Bladder is within normal limits.

Stomach/Bowel: Stomach is within normal limits.

No evidence of bowel obstruction.

Normal appendix (series 2/image 33).

Mild left colonic wall thickening, most pronounced in the rectum
(series 2/images 93 and 95), suggesting infectious/inflammatory
proctitis.

No evidence of diverticulitis.

No drainable fluid collection/abscess.  No free air.

Vascular/Lymphatic: No evidence of abdominal aortic aneurysm.

No suspicious abdominopelvic lymphadenopathy.

Reproductive: Prostate is unremarkable.

Other: No abdominopelvic ascites.

Musculoskeletal: Visualized osseous structures are within normal
limits.
IMPRESSION: Mild wall thickening involving the rectum, suggesting
infectious/inflammatory proctitis.

No drainable fluid collection/abscess.  No free air.

No evidence of diverticulitis.

## 2019-11-09 DIAGNOSIS — Z20828 Contact with and (suspected) exposure to other viral communicable diseases: Secondary | ICD-10-CM | POA: Diagnosis not present

## 2019-11-09 DIAGNOSIS — Z03818 Encounter for observation for suspected exposure to other biological agents ruled out: Secondary | ICD-10-CM | POA: Diagnosis not present

## 2021-11-26 ENCOUNTER — Encounter (HOSPITAL_COMMUNITY): Payer: Self-pay

## 2021-11-26 ENCOUNTER — Emergency Department (HOSPITAL_COMMUNITY)
Admission: EM | Admit: 2021-11-26 | Discharge: 2021-11-27 | Disposition: A | Discharge status: Home / Self Care | Payer: 59 | Attending: Emergency Medicine | Admitting: Emergency Medicine

## 2021-11-26 DIAGNOSIS — R1031 Right lower quadrant pain: Secondary | ICD-10-CM | POA: Diagnosis present

## 2021-11-26 DIAGNOSIS — D72829 Elevated white blood cell count, unspecified: Secondary | ICD-10-CM | POA: Insufficient documentation

## 2021-11-26 DIAGNOSIS — Z7982 Long term (current) use of aspirin: Secondary | ICD-10-CM | POA: Insufficient documentation

## 2021-11-26 DIAGNOSIS — K2900 Acute gastritis without bleeding: Secondary | ICD-10-CM

## 2021-11-26 LAB — CBC
HCT: 44.5 % (ref 39.0–52.0)
Hemoglobin: 15.4 g/dL (ref 13.0–17.0)
MCH: 31.1 pg (ref 26.0–34.0)
MCHC: 34.6 g/dL (ref 30.0–36.0)
MCV: 89.9 fL (ref 80.0–100.0)
Platelets: 222 10*3/uL (ref 150–400)
RBC: 4.95 MIL/uL (ref 4.22–5.81)
RDW: 12.8 % (ref 11.5–15.5)
WBC: 12.7 10*3/uL — ABNORMAL HIGH (ref 4.0–10.5)
nRBC: 0 % (ref 0.0–0.2)

## 2021-11-26 LAB — COMPREHENSIVE METABOLIC PANEL
ALT: 41 U/L (ref 0–44)
AST: 39 U/L (ref 15–41)
Albumin: 4.2 g/dL (ref 3.5–5.0)
Alkaline Phosphatase: 59 U/L (ref 38–126)
Anion gap: 7 (ref 5–15)
BUN: 15 mg/dL (ref 6–20)
CO2: 26 mmol/L (ref 22–32)
Calcium: 9.2 mg/dL (ref 8.9–10.3)
Chloride: 104 mmol/L (ref 98–111)
Creatinine, Ser: 1.08 mg/dL (ref 0.61–1.24)
GFR, Estimated: >60 mL/min (ref >60)
Glucose, Bld: 96 mg/dL (ref 70–99)
Potassium: 4.1 mmol/L (ref 3.5–5.1)
Sodium: 137 mmol/L (ref 135–145)
Total Bilirubin: 0.4 mg/dL (ref 0.3–1.2)
Total Protein: 7 g/dL (ref 6.5–8.1)

## 2021-11-26 LAB — URINALYSIS, ROUTINE W REFLEX MICROSCOPIC
Bilirubin Urine: NEGATIVE
Glucose, UA: NEGATIVE mg/dL
Hgb urine dipstick: NEGATIVE
Ketones, ur: NEGATIVE mg/dL
Leukocytes,Ua: NEGATIVE
Nitrite: NEGATIVE
Protein, ur: NEGATIVE mg/dL
Specific Gravity, Urine: 1.018 (ref 1.005–1.030)
pH: 5 (ref 5.0–8.0)

## 2021-11-26 LAB — LIPASE, BLOOD: Lipase: 26 U/L (ref 11–51)

## 2021-11-26 NOTE — ED Triage Notes (Signed)
Pt reports he thinks that he has appendicitis. Pt reports RLQ. Pt denies any fever,N&V.Marland Kitchen  ?

## 2021-11-26 NOTE — ED Provider Triage Note (Signed)
Emergency Medicine Provider Triage Evaluation Note ? ?Alexander Michael , a 36 y.o. male  was evaluated in triage.  Pt complains of RLQ abdominal pain since earlier today, steadily worsening.  Denies nausea/vomiting, fever, chills. ? ?Review of Systems  ?Positive: RLQ pain ?Negative: fever ? ?Physical Exam  ?BP (!) 174/90 (BP Location: Right Arm)   Pulse 83   Temp 99.4 ?F (37.4 ?C) (Oral)   Resp 18   SpO2 100%  ?Gen:   Awake, no distress   ?Resp:  Normal effort  ?MSK:   Moves extremities without difficulty  ?Other:  Tender RLQ, no RUQ or right CVA tenderness ? ?Medical Decision Making  ?Medically screening exam initiated at 10:21 PM.  Appropriate orders placed.  Alexander Michael was informed that the remainder of the evaluation will be completed by another provider, this initial triage assessment does not replace that evaluation, and the importance of remaining in the ED until their evaluation is complete. ? ?RLQ pain.  Will get labs, CT. ?  ?Larene Pickett, PA-C ?11/26/21 2222 ? ?

## 2021-11-27 ENCOUNTER — Emergency Department (HOSPITAL_COMMUNITY): Payer: 59

## 2021-11-27 MED ORDER — DICYCLOMINE HCL 20 MG PO TABS: 20.0000 mg | ORAL_TABLET | Freq: Two times a day (BID) | ORAL | 0 refills | Status: AC | PRN

## 2021-11-27 MED ORDER — IOHEXOL 300 MG/ML  SOLN
100.0000 mL | Freq: Once | INTRAMUSCULAR | Status: AC | PRN
  Administered 2021-11-27: 100 mL via INTRAVENOUS

## 2021-11-27 MED ORDER — PANTOPRAZOLE SODIUM 40 MG PO TBEC
40.0000 mg | DELAYED_RELEASE_TABLET | Freq: Once | ORAL | Status: AC
  Administered 2021-11-27: 40 mg via ORAL
  Filled 2021-11-27: qty 1

## 2021-11-27 MED ORDER — DICYCLOMINE HCL 10 MG PO CAPS
10.0000 mg | ORAL_CAPSULE | Freq: Once | ORAL | Status: AC
  Administered 2021-11-27: 10 mg via ORAL
  Filled 2021-11-27: qty 1

## 2021-11-27 NOTE — ED Provider Notes (Signed)
?[facility] EMERGENCY DEPARTMENT ?Provider Note ? ? ?CSN: [id number] ?Arrival date & time: 11/26/21  2124 ? ?  ? ?History ? ?Chief Complaint  ?Patient presents with  ? Abdominal Pain  ? ? ?Alexander Michael is a 36 y.o. male. ? ?Patient reports onset of right lower quadrant abdominal pain starting around 2000-2030 last night.  Patient reports that right lower quadrant pain is constant and is characterized as a stabbing pain.  He denies any nausea,dysuria, new back pain, decreased appetite, vomiting, flank pain.  Patient reports that temperature at home range from 97 to 99 degrees.  He denies any chills or recent injury.  Patient denies any constipation or diarrhea.  He reports that his last meal was between 1819 100 hours last night.  Patient reports that he normally has daily bowel movements.  He denies any recent unintentional weight loss.  Pain is better with sitting still but becomes sharp and stabbing with any movement.  He denies any radiation to the groin area.  Patient's medications include a multivitamin and fish oil.  He reports that his 31-year-old had a fever and some vomiting within the last week otherwise he is not aware of any sick contacts. ? ?  ? ?Home Medications ?Prior to Admission medications   ?Medication Sig Start Date End Date Taking? Authorizing Provider  ?dicyclomine (BENTYL) 20 MG tablet Take 1 tablet (20 mg total) by mouth 2 (two) times daily as needed for spasms (abdominal pain). 11/27/21  Yes Alvira Monday, MD  ?amLODipine (NORVASC) 5 MG tablet Take 5 mg by mouth daily. 03/30/18   [provider]  ?aspirin 81 MG tablet Take 1 tablet (81 mg total) by mouth daily. ?Patient not taking: Reported on 07/29/2019 04/20/18   Pricilla Loveless, MD  ?ciprofloxacin (CIPRO) 500 MG tablet Take 1 tablet (500 mg total) by mouth 2 (two) times daily. 12/04/17   Kathlen Mody, MD  ?sertraline (ZOLOFT) 25 MG tablet  07/21/19   [provider]  ?topiramate (TOPAMAX) 50 MG tablet  Take 50 mg by mouth 2 (two) times daily. 07/28/19   [provider]  ?   ? ?Allergies    ?Patient has no known allergies.   ? ?Review of Systems   ?Review of Systems  ?Constitutional:  Negative for appetite change, chills, fever and unexpected weight change.  ?HENT:  Negative for congestion and sore throat.   ?Eyes:  Negative for pain.  ?Respiratory:  Negative for shortness of breath.   ?Cardiovascular:  Negative for chest pain.  ?Gastrointestinal:  Positive for abdominal pain. Negative for blood in stool, constipation, diarrhea, nausea and vomiting.  ?Genitourinary:  Negative for dysuria and testicular pain.  ?Musculoskeletal:  Negative for back pain.  ?Skin:  Negative for color change.  ?Psychiatric/Behavioral:  The patient is not nervous/anxious.   ? ?Physical Exam ?Updated Vital Signs ?BP (!) 148/76 (BP Location: Right Arm)   Pulse 68   Temp 99 ?F (37.2 ?C) (Oral)   Resp 16   SpO2 97%  ?Physical Exam ?Constitutional:   ?   General: He is not in acute distress. ?   Appearance: He is well-developed. He is not ill-appearing, toxic-appearing or diaphoretic.  ?Cardiovascular:  ?   Rate and Rhythm: Normal rate and regular rhythm.  ?   Heart sounds: Normal heart sounds. No murmur heard. ?Pulmonary:  ?   Effort: Pulmonary effort is normal.  ?   Breath sounds: Normal breath sounds.  ?Abdominal:  ?   General: Bowel  sounds are decreased.  ?   Tenderness: There is abdominal tenderness in the right lower quadrant. There is no right CVA tenderness, left CVA tenderness or guarding. Positive signs include McBurney's sign. Negative signs include Murphy's sign.  ?Skin: ?   Capillary Refill: Capillary refill takes less than 2 seconds.  ?Neurological:  ?   Mental Status: He is alert.  ? ? ?ED Results / Procedures / Treatments   ?Labs ?(all labs ordered are listed, but only abnormal results are displayed) ?Labs Reviewed  ?CBC - Abnormal; Notable for the following components:  ?    Result Value  ? WBC 12.7 (*)   ? All  other components within normal limits  ?LIPASE, BLOOD  ?COMPREHENSIVE METABOLIC PANEL  ?URINALYSIS, ROUTINE W REFLEX MICROSCOPIC  ? ? ?EKG ?None ? ?Radiology ?CT ABDOMEN PELVIS W CONTRAST ? ?Result Date: 11/27/2021 ?CLINICAL DATA:  Right lower quadrant abdominal pain EXAM: CT ABDOMEN AND PELVIS WITH CONTRAST TECHNIQUE: Multidetector CT imaging of the abdomen and pelvis was performed using the standard protocol following bolus administration of intravenous contrast. RADIATION DOSE REDUCTION: This exam was performed according to the departmental dose-optimization program which includes automated exposure control, adjustment of the mA and/or kV according to patient size and/or use of iterative reconstruction technique. CONTRAST:  OMNIPAQUE IOHEXOL 300 MG/ML  SOLN COMPARISON:  12/03/2017 FINDINGS: Lower chest: Lung bases are clear. Hepatobiliary: No focal liver abnormality is seen. No gallstones, gallbladder wall thickening, or biliary dilatation. Pancreas: Unremarkable. No pancreatic ductal dilatation or surrounding inflammatory changes. Spleen: Normal in size without focal abnormality. Adrenals/Urinary Tract: Adrenal glands are unremarkable. Kidneys are normal, without renal calculi, focal lesion, or hydronephrosis. Bladder is unremarkable. Stomach/Bowel: Stomach is within normal limits. Appendix appears normal. No evidence of bowel wall thickening, distention, or inflammatory changes. Vascular/Lymphatic: No significant vascular findings are present. No enlarged abdominal or pelvic lymph nodes. Reproductive: Prostate is unremarkable. Other: No free air or free fluid in the abdomen. Abdominal wall musculature appears intact. Moderately prominent mesenteric lymph nodes, measuring up to about 12 mm short axis dimension. Similar appearance to previous study, likely reactive. Musculoskeletal: No acute or significant osseous findings. IMPRESSION: 1. No acute process demonstrated in the abdomen or pelvis. No evidence of  bowel obstruction or inflammation. Appendix is normal. Electronically Signed   By: Burman Nieves M.D.   On: 11/27/2021 00:25   ? ?Procedures ?Procedures  ? ? ?Medications Ordered in ED ?Medications  ?iohexol (OMNIPAQUE) 300 MG/ML solution 100 mL (100 mLs Intravenous Contrast Given 11/27/21 0013)  ?pantoprazole (PROTONIX) EC tablet 40 mg (40 mg Oral Given 11/27/21 0800)  ?dicyclomine (BENTYL) capsule 10 mg (10 mg Oral Given 11/27/21 0800)  ? ? ?ED Course/ Medical Decision Making/ A&P ?  ?                        ?Medical Decision Making ?Patient is 36 year old male presenting with abdominal pain. Labs reassuring and only notable for mildly elevated white count of 12. Abdominal CT without remarkable findings. Considered appendicitis, diverticulitis, obstruction all less likely given CT findings and lack of emesis and in setting of normal bowel pattern without hematochezia/melena. Patient with mildly elevated temp of 99. Notable elevated blood pressure of 174/90 on initial presentation, now improved to 139/75. Due to location of pain, expect RUQ [place] to be less informative. CMP and U/A both unremarkable. Reported hx of son with fever and vomiting, expect patient may have viral gastritis contributing to symptoms. Treated  with bentyl and PPI and deemed appropriate for discharge home with PCP follow up for any worsening symptoms.  ? ? ? ?Final Clinical Impression(s) / ED Diagnoses ?Final diagnoses:  ?Right lower quadrant abdominal pain  ?Other acute gastritis, presence of bleeding unspecified  ? ? ?Rx / DC Orders ?ED Discharge Orders   ? ?      Ordered  ?  dicyclomine (BENTYL) 20 MG tablet  2 times daily PRN       ? 11/27/21 0757  ? ?  ?  ? ?  ? ? ?  ?Ronnald RampSimmons-Robinson, Trevaughn Schear, MD ?11/27/21 [phone] ? ?  ?Alvira MondaySchlossman, Erin, MD ?11/27/21 2143 ? ?
